# Patient Record
Sex: Female | Born: 1935 | Race: White | Hispanic: No | Marital: Married | Smoking: Former smoker
Health system: Southern US, Community
[De-identification: ages and names within clinical notes are randomized; demographics above are authoritative.]

## PROBLEM LIST (undated history)

## (undated) DIAGNOSIS — F0390 Unspecified dementia without behavioral disturbance: Secondary | ICD-10-CM

## (undated) DIAGNOSIS — I4891 Unspecified atrial fibrillation: Secondary | ICD-10-CM

## (undated) DIAGNOSIS — G459 Transient cerebral ischemic attack, unspecified: Secondary | ICD-10-CM

## (undated) DIAGNOSIS — H269 Unspecified cataract: Secondary | ICD-10-CM

## (undated) DIAGNOSIS — I10 Essential (primary) hypertension: Secondary | ICD-10-CM

## (undated) DIAGNOSIS — C801 Malignant (primary) neoplasm, unspecified: Secondary | ICD-10-CM

## (undated) HISTORY — PX: UPPER GI ENDOSCOPY: SHX6162

## (undated) HISTORY — PX: BREAST SURGERY: SHX581

## (undated) HISTORY — PX: COLONOSCOPY: SHX174

## (undated) HISTORY — PX: HYSTEROTOMY: SHX1776

---

## 2010-07-25 ENCOUNTER — Emergency Department: Payer: Self-pay | Admitting: Emergency Medicine

## 2013-02-16 ENCOUNTER — Emergency Department: Payer: Self-pay | Admitting: Emergency Medicine

## 2013-05-11 ENCOUNTER — Emergency Department: Payer: Self-pay | Admitting: Emergency Medicine

## 2013-05-11 LAB — CBC
HCT: 42.4 % (ref 35.0–47.0)
MCH: 29.1 pg (ref 26.0–34.0)
MCHC: 33.9 g/dL (ref 32.0–36.0)
MCV: 86 fL (ref 80–100)
Platelet: 266 10*3/uL (ref 150–440)
RBC: 4.92 10*6/uL (ref 3.80–5.20)

## 2013-05-11 LAB — URINALYSIS, COMPLETE
Bacteria: NONE SEEN
Bilirubin,UR: NEGATIVE
Glucose,UR: NEGATIVE mg/dL (ref 0–75)
Ketone: NEGATIVE
Leukocyte Esterase: NEGATIVE
RBC,UR: 17 /HPF (ref 0–5)
Specific Gravity: 1.008 (ref 1.003–1.030)
Squamous Epithelial: 1
Transitional Epi: <1

## 2013-05-11 LAB — COMPREHENSIVE METABOLIC PANEL
Alkaline Phosphatase: 87 U/L (ref 50–136)
Anion Gap: 5 — ABNORMAL LOW (ref 7–16)
BUN: 13 mg/dL (ref 7–18)
Creatinine: 1.21 mg/dL (ref 0.60–1.30)
EGFR (African American): 48 — ABNORMAL LOW
EGFR (Non-African Amer.): 41 — ABNORMAL LOW
Glucose: 108 mg/dL — ABNORMAL HIGH (ref 65–99)
Osmolality: 276 (ref 275–301)
Potassium: 3.7 mmol/L (ref 3.5–5.1)
SGOT(AST): 29 U/L (ref 15–37)
SGPT (ALT): 33 U/L (ref 12–78)
Sodium: 138 mmol/L (ref 136–145)
Total Protein: 7.4 g/dL (ref 6.4–8.2)

## 2015-01-27 ENCOUNTER — Emergency Department
Admission: EM | Admit: 2015-01-27 | Discharge: 2015-01-27 | Disposition: A | Discharge status: Home / Self Care | Payer: Medicare PPO | Attending: Emergency Medicine | Admitting: Emergency Medicine

## 2015-01-27 DIAGNOSIS — Y838 Other surgical procedures as the cause of abnormal reaction of the patient, or of later complication, without mention of misadventure at the time of the procedure: Secondary | ICD-10-CM | POA: Insufficient documentation

## 2015-01-27 DIAGNOSIS — K1379 Other lesions of oral mucosa: Secondary | ICD-10-CM

## 2015-01-27 DIAGNOSIS — L7622 Postprocedural hemorrhage and hematoma of skin and subcutaneous tissue following other procedure: Secondary | ICD-10-CM

## 2015-01-27 MED ORDER — AMOXICILLIN 500 MG PO CAPS: 500.0000 mg | ORAL_CAPSULE | Freq: Two times a day (BID) | ORAL | Status: AC

## 2015-01-27 MED ORDER — AMOXICILLIN 500 MG PO CAPS
ORAL_CAPSULE | ORAL | Status: AC
  Filled 2015-01-27: qty 1

## 2015-01-27 MED ORDER — LIDOCAINE-EPINEPHRINE (PF) 1 %-1:200000 IJ SOLN
INTRAMUSCULAR | Status: AC
  Administered 2015-01-27: 10 mL
  Filled 2015-01-27: qty 30

## 2015-01-27 MED ORDER — LIDOCAINE-EPINEPHRINE (PF) 1 %-1:200000 IJ SOLN
10.0000 mL | Freq: Once | INTRAMUSCULAR | Status: AC
  Administered 2015-01-27: 10 mL

## 2015-01-27 MED ORDER — AMOXICILLIN 500 MG PO CAPS: 500.0000 mg | ORAL_CAPSULE | Freq: Once | ORAL | Status: DC

## 2015-01-27 NOTE — ED Provider Notes (Signed)
[facility] Emergency Department Provider Note  ____________________________________________  Time seen: 3:45 AM  I have reviewed the triage vital signs and the nursing notes.   HISTORY  Chief Complaint Post-op Problem      HPI Whitnee Orzel is a 81 y.o. female presents with history of having 2 central incisors pulled by a Dentist in [place] at 7:30 PM last night with continued bleeding 2 hours after leaving the dental office. Patient of note is on Eliquis.     Past medical history Pacemaker  A. fib  Past surgical history   pacemaker No current outpatient prescriptions on file.  Allergies Review of patient's allergies indicates not on file.  No family history on file.  Social History History  Substance Use Topics  . Smoking status: Not on file  . Smokeless tobacco: Not on file  . Alcohol Use: Not on file    Review of Systems  Constitutional: Negative for fever. Eyes: Negative for visual changes. ENT: Negative for sore throat. Bleeding from tooth extraction site Cardiovascular: Negative for chest pain. Respiratory: Negative for shortness of breath. Gastrointestinal: Negative for abdominal pain, vomiting and diarrhea. Genitourinary: Negative for dysuria. Musculoskeletal: Negative for back pain. Skin: Negative for rash. Neurological: Negative for headaches, focal weakness or numbness.   10-point ROS otherwise negative.  ____________________________________________   PHYSICAL EXAM:  VITAL SIGNS: ED Triage Vitals  Enc Vitals Group     BP 01/27/15 0346 179/102 mmHg     Pulse Rate 01/27/15 0346 115     Resp 01/27/15 0346 18     Temp --      Temp src --      SpO2 01/27/15 0346 97 %     Weight 01/27/15 0346 151 lb (68.493 kg)     Height 01/27/15 0346 '5\' 4"'$  (1.626 m)     Head Cir --      Peak Flow --      Pain Score --      Pain Loc --      Pain Edu? --      Excl. in [place]? --     Constitutional: Alert and oriented. Well  appearing and in no distress. Eyes: Conjunctivae are normal. PERRL. Normal extraocular movements. ENT   Head: Normocephalic and atraumatic.   Nose: No congestion/rhinnorhea.   Mouth/Throat: Mucous membranes are moist. Active bleeding from left mandible central incisor   Neck: No stridor. Hematological/Lymphatic/Immunilogical: No cervical lymphadenopathy. Cardiovascular: Normal rate, regular rhythm. Normal and symmetric distal pulses are present in all extremities. No murmurs, rubs, or gallops. Respiratory: Normal respiratory effort without tachypnea nor retractions. Breath sounds are clear and equal bilaterally. No wheezes/rales/rhonchi. Gastrointestinal: Soft and nontender. No distention. There is no CVA tenderness. Genitourinary: deferred Musculoskeletal: Nontender with normal range of motion in all extremities. No joint effusions.  No lower extremity tenderness nor edema. Neurologic:  Normal speech and language. No gross focal neurologic deficits are appreciated. Speech is normal.  Skin:  Skin is warm, dry and intact. No rash noted. Psychiatric: Mood and affect are normal. Speech and behavior are normal. Patient exhibits appropriate insight and judgment.  ____________________________________________      INITIAL IMPRESSION / ASSESSMENT AND PLAN / ED COURSE  Pertinent labs & imaging results that were available during my care of the patient were reviewed by me and considered in my medical decision making (see chart for details).  Attempted to control bleeding with Surgicel however bleeding persisted. As such patient was injected 1% lidocaine with epinephrine and subsequently  local cauterization was performed. Bleeding cessation was achieved with the procedure  ____________________________________________   FINAL CLINICAL IMPRESSION(S) / ED DIAGNOSES  Final diagnoses:  Bleeding from mouth  Postoperative hemorrhage of subcutaneous tissue following non-dermatologic  procedure      Gregor Hams, MD 01/28/15 [phone]

## 2015-01-27 NOTE — ED Notes (Signed)
Surgicel gauze applied to bleeding area in oral cavity by Dr. Owens Shark.

## 2015-01-27 NOTE — ED Notes (Signed)
Pt to triage via w/c actively bleeding from mouth; pt reports having 2 lower teeth pulled at 730pm; 2hrs later began bleeding and has not stopped; pt currently on Eloquist

## 2015-01-27 NOTE — ED Notes (Signed)
RN returns to bedside to reassess site. Patient with some very minor oozing from extraction sites, however site that was previously bleeding does not appear to be contributory to the aforementioned oozing. MD made aware.

## 2015-01-27 NOTE — ED Notes (Signed)
RN returns to bedside to assess bleeding site. Gauze and surgicel removed. Hemostasis achieved. MD made aware and asked to come to bedside for patient reassessment. MD in to speak with patient.

## 2015-01-27 NOTE — ED Notes (Signed)
MD and RN to bedside to attempt cautery of bleeding area. Area numbed by Dr. Owens Shark with 1% Lidocaine with Epi. Cautery attempted - patient's bleeding slowed, however did not resolve. Surgicel placed once again. RN to reassess at 0600 per MD direction.

## 2015-01-27 NOTE — Discharge Instructions (Signed)
Dental Care and Dentist Visits Dental care supports good overall health. Regular dental visits can also help you avoid dental pain, bleeding, infection, and other more serious health problems in the future. It is important to keep the mouth healthy because diseases in the teeth, gums, and other oral tissues can spread to other areas of the body. Some problems, such as diabetes, heart disease, and pre-term labor have been associated with poor oral health.  See your dentist every 6 months. If you experience emergency problems such as a toothache or broken tooth, go to the dentist right away. If you see your dentist regularly, you may catch problems early. It is easier to be treated for problems in the early stages.  WHAT TO EXPECT AT A DENTIST VISIT  Your dentist will look for many common oral health problems and recommend proper treatment. At your regular dental visit, you can expect:  Gentle cleaning of the teeth and gums. This includes scraping and polishing. This helps to remove the sticky substance around the teeth and gums (plaque). Plaque forms in the mouth shortly after eating. Over time, plaque hardens on the teeth as tartar. If tartar is not removed regularly, it can cause problems. Cleaning also helps remove stains.  Periodic X-rays. These pictures of the teeth and supporting bone will help your dentist assess the health of your teeth.  Periodic fluoride treatments. Fluoride is a natural mineral shown to help strengthen teeth. Fluoride treatmentinvolves applying a fluoride gel or varnish to the teeth. It is most commonly done in children.  Examination of the mouth, tongue, jaws, teeth, and gums to look for any oral health problems, such as:  Cavities (dental caries). This is decay on the tooth caused by plaque, sugar, and acid in the mouth. It is best to catch a cavity when it is small.  Inflammation of the gums caused by plaque buildup (gingivitis).  Problems with the mouth or malformed  or misaligned teeth.  Oral cancer or other diseases of the soft tissues or jaws. KEEP YOUR TEETH AND GUMS HEALTHY For healthy teeth and gums, follow these general guidelines as well as your dentist's specific advice:  Have your teeth professionally cleaned at the dentist every 6 months.  Brush twice daily with a fluoride toothpaste.  Floss your teeth daily.  Ask your dentist if you need fluoride supplements, treatments, or fluoride toothpaste.  Eat a healthy diet. Reduce foods and drinks with added sugar.  Avoid smoking. TREATMENT FOR ORAL HEALTH PROBLEMS If you have oral health problems, treatment varies depending on the conditions present in your teeth and gums.  Your caregiver will most likely recommend good oral hygiene at each visit.  For cavities, gingivitis, or other oral health disease, your caregiver will perform a procedure to treat the problem. This is typically done at a separate appointment. Sometimes your caregiver will refer you to another dental specialist for specific tooth problems or for surgery. SEEK IMMEDIATE DENTAL CARE IF:  You have pain, bleeding, or soreness in the gum, tooth, jaw, or mouth area.  A permanent tooth becomes loose or separated from the gum socket.  You experience a blow or injury to the mouth or jaw area. Document Released: 04/12/2011 Document Revised: 10/23/2011 Document Reviewed: 04/12/2011 [facility] Patient Information 2015 [place], [place]. This information is not intended to replace advice given to you by your health care provider. Make sure you discuss any questions you have with your health care provider.  PLEASE FOLLOW-UP WITH YOUR DENTIST TODAY

## 2016-03-07 ENCOUNTER — Observation Stay
Admission: EM | Admit: 2016-03-07 | Discharge: 2016-03-08 | Disposition: A | Discharge status: Home / Self Care | Payer: Medicare HMO | Attending: Internal Medicine | Admitting: Internal Medicine

## 2016-03-07 ENCOUNTER — Emergency Department: Payer: Medicare HMO

## 2016-03-07 ENCOUNTER — Observation Stay: Payer: Medicare HMO

## 2016-03-07 ENCOUNTER — Observation Stay (HOSPITAL_BASED_OUTPATIENT_CLINIC_OR_DEPARTMENT_OTHER)
Admit: 2016-03-07 | Discharge: 2016-03-07 | Disposition: A | Discharge status: Home / Self Care | Payer: Medicare HMO | Attending: Internal Medicine | Admitting: Internal Medicine

## 2016-03-07 ENCOUNTER — Encounter: Payer: Self-pay | Admitting: Emergency Medicine

## 2016-03-07 DIAGNOSIS — R531 Weakness: Secondary | ICD-10-CM | POA: Diagnosis present

## 2016-03-07 DIAGNOSIS — I639 Cerebral infarction, unspecified: Secondary | ICD-10-CM | POA: Diagnosis not present

## 2016-03-07 DIAGNOSIS — Z9889 Other specified postprocedural states: Secondary | ICD-10-CM | POA: Insufficient documentation

## 2016-03-07 DIAGNOSIS — Z853 Personal history of malignant neoplasm of breast: Secondary | ICD-10-CM | POA: Diagnosis not present

## 2016-03-07 DIAGNOSIS — IMO0002 Reserved for concepts with insufficient information to code with codable children: Secondary | ICD-10-CM

## 2016-03-07 DIAGNOSIS — I6330 Cerebral infarction due to thrombosis of unspecified cerebral artery: Secondary | ICD-10-CM

## 2016-03-07 DIAGNOSIS — I2510 Atherosclerotic heart disease of native coronary artery without angina pectoris: Secondary | ICD-10-CM | POA: Insufficient documentation

## 2016-03-07 DIAGNOSIS — Z87891 Personal history of nicotine dependence: Secondary | ICD-10-CM | POA: Insufficient documentation

## 2016-03-07 DIAGNOSIS — I6523 Occlusion and stenosis of bilateral carotid arteries: Secondary | ICD-10-CM | POA: Insufficient documentation

## 2016-03-07 DIAGNOSIS — I10 Essential (primary) hypertension: Secondary | ICD-10-CM | POA: Diagnosis not present

## 2016-03-07 DIAGNOSIS — Z950 Presence of cardiac pacemaker: Secondary | ICD-10-CM | POA: Insufficient documentation

## 2016-03-07 DIAGNOSIS — Z886 Allergy status to analgesic agent status: Secondary | ICD-10-CM | POA: Insufficient documentation

## 2016-03-07 DIAGNOSIS — F329 Major depressive disorder, single episode, unspecified: Secondary | ICD-10-CM | POA: Insufficient documentation

## 2016-03-07 DIAGNOSIS — Z882 Allergy status to sulfonamides status: Secondary | ICD-10-CM | POA: Insufficient documentation

## 2016-03-07 DIAGNOSIS — Z888 Allergy status to other drugs, medicaments and biological substances status: Secondary | ICD-10-CM | POA: Insufficient documentation

## 2016-03-07 DIAGNOSIS — I700 Atherosclerosis of aorta: Secondary | ICD-10-CM | POA: Diagnosis not present

## 2016-03-07 DIAGNOSIS — I69898 Other sequelae of other cerebrovascular disease: Secondary | ICD-10-CM | POA: Diagnosis present

## 2016-03-07 DIAGNOSIS — I480 Paroxysmal atrial fibrillation: Secondary | ICD-10-CM | POA: Diagnosis not present

## 2016-03-07 DIAGNOSIS — Z8673 Personal history of transient ischemic attack (TIA), and cerebral infarction without residual deficits: Secondary | ICD-10-CM | POA: Diagnosis not present

## 2016-03-07 DIAGNOSIS — F0390 Unspecified dementia without behavioral disturbance: Secondary | ICD-10-CM | POA: Diagnosis not present

## 2016-03-07 HISTORY — DX: Essential (primary) hypertension: I10

## 2016-03-07 HISTORY — DX: Unspecified dementia, unspecified severity, without behavioral disturbance, psychotic disturbance, mood disturbance, and anxiety: F03.90

## 2016-03-07 HISTORY — DX: Malignant (primary) neoplasm, unspecified: C80.1

## 2016-03-07 HISTORY — DX: Transient cerebral ischemic attack, unspecified: G45.9

## 2016-03-07 HISTORY — DX: Unspecified cataract: H26.9

## 2016-03-07 LAB — URINALYSIS COMPLETE WITH MICROSCOPIC (ARMC ONLY)
Bacteria, UA: NONE SEEN
Bilirubin Urine: NEGATIVE
GLUCOSE, UA: NEGATIVE mg/dL
KETONES UR: NEGATIVE mg/dL
Leukocytes, UA: NEGATIVE
Nitrite: NEGATIVE
PROTEIN: 100 mg/dL — AB
SPECIFIC GRAVITY, URINE: 1.005 (ref 1.005–1.030)
Squamous Epithelial / LPF: NONE SEEN
pH: 7 (ref 5.0–8.0)

## 2016-03-07 LAB — CBC
HCT: 32.3 % — ABNORMAL LOW (ref 35.0–47.0)
HEMOGLOBIN: 10.4 g/dL — AB (ref 12.0–16.0)
MCH: 23.1 pg — ABNORMAL LOW (ref 26.0–34.0)
MCHC: 32.2 g/dL (ref 32.0–36.0)
MCV: 71.8 fL — AB (ref 80.0–100.0)
Platelets: 327 10*3/uL (ref 150–440)
RBC: 4.5 MIL/uL (ref 3.80–5.20)
RDW: 16.4 % — ABNORMAL HIGH (ref 11.5–14.5)
WBC: 10.2 10*3/uL (ref 3.6–11.0)

## 2016-03-07 LAB — COMPREHENSIVE METABOLIC PANEL
ALBUMIN: 4 g/dL (ref 3.5–5.0)
ALT: 29 U/L (ref 14–54)
AST: 50 U/L — ABNORMAL HIGH (ref 15–41)
Alkaline Phosphatase: 92 U/L (ref 38–126)
Anion gap: 11 (ref 5–15)
BUN: 17 mg/dL (ref 6–20)
CO2: 24 mmol/L (ref 22–32)
CREATININE: 1.19 mg/dL — AB (ref 0.44–1.00)
Calcium: 9.5 mg/dL (ref 8.9–10.3)
Chloride: 104 mmol/L (ref 101–111)
GFR calc Af Amer: 47 mL/min — ABNORMAL LOW (ref >60)
GFR, EST NON AFRICAN AMERICAN: 40 mL/min — AB (ref >60)
Glucose, Bld: 148 mg/dL — ABNORMAL HIGH (ref 65–99)
Potassium: 4 mmol/L (ref 3.5–5.1)
Sodium: 139 mmol/L (ref 135–145)
TOTAL PROTEIN: 7.8 g/dL (ref 6.5–8.1)
Total Bilirubin: 0.5 mg/dL (ref 0.3–1.2)

## 2016-03-07 LAB — TROPONIN I

## 2016-03-07 MED ORDER — SODIUM CHLORIDE 0.9 % IV SOLN
INTRAVENOUS | Status: DC
  Administered 2016-03-07 – 2016-03-08 (×2): via INTRAVENOUS

## 2016-03-07 MED ORDER — HYDRALAZINE HCL 20 MG/ML IJ SOLN
10.0000 mg | Freq: Four times a day (QID) | INTRAMUSCULAR | Status: DC | PRN
  Administered 2016-03-08: 07:00:00 10 mg via INTRAVENOUS
  Filled 2016-03-07: qty 1

## 2016-03-07 MED ORDER — CLOPIDOGREL BISULFATE 75 MG PO TABS
75.0000 mg | ORAL_TABLET | Freq: Every day | ORAL | Status: DC
  Filled 2016-03-07: qty 1

## 2016-03-07 MED ORDER — SENNOSIDES-DOCUSATE SODIUM 8.6-50 MG PO TABS: 1.0000 | ORAL_TABLET | Freq: Every evening | ORAL | Status: DC | PRN

## 2016-03-07 MED ORDER — STROKE: EARLY STAGES OF RECOVERY BOOK
Freq: Once | Status: AC
  Administered 2016-03-07: 19:00:00

## 2016-03-07 MED ORDER — ENOXAPARIN SODIUM 40 MG/0.4ML ~~LOC~~ SOLN: 40.0000 mg | SUBCUTANEOUS | Status: DC

## 2016-03-07 NOTE — H&P (Signed)
[place] [facility] [place] NAME: Melissa Palmer    MR#:  [id number]  DATE OF BIRTH:  Jan 09, 1928  DATE OF ADMISSION:  03/07/2016  PRIMARY CARE PHYSICIAN: Denita Lung, DO   REQUESTING/REFERRING PHYSICIAN: Delman Kitten, MD  CHIEF COMPLAINT:   Chief Complaint  Patient presents with  . Altered Mental Status   HISTORY OF PRESENT ILLNESS:  Melissa Palmer  is a 82 y.o. female with a known history of TIA and 2 strokes in the past who was taken off of Eliquis for a bronchoscopy on yesterday at [facility]. Afterward returned home and as sedation wore off appeared to return to baseline having normal conversation with family members until going to bed. This AM awakened at about 0400.  Had to go to the bathroom repeatedly and required assistance due to complaints that her Rt leg was not working correctly.  Was also noted by family to be confused/Hallucinating. Remains off Eliquis. Patient with a history of pacemaker and PAF. PAST MEDICAL HISTORY:   Past Medical History:  Diagnosis Date  . Cancer ([place])   . Cataract   . Dementia   . Hypertension   . TIA (transient ischemic attack)     PAST SURGICAL HISTORY:   Past Surgical History:  Procedure Laterality Date  . BREAST SURGERY    . COLONOSCOPY    . HYSTEROTOMY    . UPPER GI ENDOSCOPY      SOCIAL HISTORY:   Social History  Substance Use Topics  . Smoking status: Former Research scientist (life sciences)  . Smokeless tobacco: Never Used  . Alcohol use No    FAMILY HISTORY:  History reviewed. No pertinent family history. Mom - brain hemorrhage  DRUG ALLERGIES:   Allergies  Allergen Reactions  . Asa [Aspirin] Anaphylaxis  . Flagyl [Metronidazole] Itching  . Hydrochlorothiazide Itching  . Sulfa Antibiotics Itching   REVIEW OF SYSTEMS:   Review of Systems  Constitutional: Negative for chills, fever and weight loss.  HENT: Negative for nosebleeds and sore throat.   Eyes: Negative for blurred vision.  Respiratory: Negative  for cough, shortness of breath and wheezing.   Cardiovascular: Negative for chest pain, orthopnea, leg swelling and PND.  Gastrointestinal: Negative for abdominal pain, constipation, diarrhea, heartburn, nausea and vomiting.  Genitourinary: Negative for dysuria and urgency.  Musculoskeletal: Negative for back pain.  Skin: Negative for rash.  Neurological: Negative for dizziness, speech change and headaches.  Endo/Heme/Allergies: Does not bruise/bleed easily.  Psychiatric/Behavioral: Positive for hallucinations. Negative for depression.   MEDICATIONS AT HOME:   Prior to Admission medications   Not on File    VITAL SIGNS:  Blood pressure (!) 172/62, pulse 63, temperature 97 F (36.1 C), temperature source Oral, resp. rate 19, height '5\' 4"'$  (1.626 m), weight 65.8 kg (145 lb), SpO2 97 %. PHYSICAL EXAMINATION:  Physical Exam  Constitutional: She is oriented to person, place, and time and well-developed, well-nourished, and in no distress.  HENT:  Head: Normocephalic and atraumatic.  Eyes: Conjunctivae and EOM are normal. Pupils are equal, round, and reactive to light.  Neck: Normal range of motion. Neck supple. No tracheal deviation present. No thyromegaly present.  Cardiovascular: Normal rate, regular rhythm and normal heart sounds.   Pulmonary/Chest: Effort normal and breath sounds normal. No respiratory distress. She has no wheezes. She exhibits no tenderness.  Abdominal: Soft. Bowel sounds are normal. She exhibits no distension. There is no tenderness.  Musculoskeletal: Normal range of motion.  Neurological: She is alert and oriented to  person, place, and time. No cranial nerve deficit.  Skin: Skin is warm and dry. No rash noted.  Psychiatric: Mood and affect normal.   LABORATORY PANEL:   CBC  Recent Labs Lab 03/07/16 1112  WBC 10.2  HGB 10.4*  HCT 32.3*  PLT 327    ------------------------------------------------------------------------------------------------------------------  Chemistries   Recent Labs Lab 03/07/16 1112  NA 139  K 4.0  CL 104  CO2 24  GLUCOSE 148*  BUN 17  CREATININE 1.19*  CALCIUM 9.5  AST 50*  ALT 29  ALKPHOS 92  BILITOT 0.5    RADIOLOGY:  Dg Chest 2 View  Result Date: 03/07/2016 CLINICAL DATA:  Status post bronchoscopy. History of breast carcinoma. Confusion. EXAM: CHEST  2 VIEW COMPARISON:  None. FINDINGS: There is generalized interstitial prominence throughout the mid and lower lung zones. There is no airspace consolidation. There are scattered areas of scarring in the right base. There are clips in each lower lobe region. Heart size and pulmonary vascularity are within normal limits. Pacemaker leads are attached to the right atrium and right ventricle. No adenopathy. There is extensive atherosclerotic calcification in the aorta. No bone lesions are evident. IMPRESSION: Generalized interstitial prominence throughout the mid and lower lung zones. Suspect fibrotic type change, although a degree of interstitial edema superimposed cannot be excluded radiographically. Scarring right base. Cardiac silhouette within normal limits. There is aortic atherosclerosis. No adenopathy evident. Pacemaker leads attached to right atrium and right ventricle. Electronically Signed   By: Lowella Grip III M.D.   On: 03/07/2016 12:42  Ct Head Wo Contrast  Result Date: 03/07/2016 CLINICAL DATA:  Confusion today. Disorientation. Visual hallucinations. EXAM: CT HEAD WITHOUT CONTRAST TECHNIQUE: Contiguous axial images were obtained from the base of the skull through the vertex without intravenous contrast. COMPARISON:  None. FINDINGS: The brain shows generalized atrophy. This is frontal lobe predominant. No evidence of acute infarction, mass lesion, hemorrhage, hydrocephalus or extra-axial collection. The calvarium is unremarkable. Sinuses,  middle ears and mastoids are clear. There is atherosclerotic calcification of the major vessels at the base of the brain. IMPRESSION: No acute finding by CT. Generalized brain atrophy, most advanced in the frontal lobes. Electronically Signed   By: Nelson Chimes M.D.   On: 03/07/2016 13:16  IMPRESSION AND PLAN:  82 yo f with h/o CVA being admitted for possible new CVA  * Acute CVA: - Neuro seen and requests further CVA w/up - stroke order sets - can't have MRI due to pacemaker - repeat CT head in am - Pavix (asa allergy), Elliquis on hold due to bronch y'day (will hold for now) - echo, carotid dopplers  * Uncontrolled HTN - likely due to acute CVA - will let it autoregulate in acute CVA phase - will prn hydralazine to keep SBP 140-160 Range  * Paroxysmal A.fib: - was on eliquis which is on hold - HR under control  * Confusion/Hallucination - could be due to effect of anesthesia wit underlying dementia - seems clear and back to baseline now. - MONITOR   All the records are reviewed and case discussed with ED provider. Management plans discussed with the patient, family and they are in agreement.  CODE STATUS: FULL CODE  TOTAL TIME TAKING CARE OF THIS PATIENT: 45 minutes.    [facility], Jayke Caul M.D on 03/07/2016 at 4:11 PM  Between 7am to 6pm - Pager - [phone]  After 6pm go to [email or web address] - Proofreader  [employer]  [phone]  CC: Primary care physician; Denita Lung, DO   Note: This dictation was prepared with Dragon dictation along with smaller phrase technology. Any transcriptional errors that result from this process are unintentional.

## 2016-03-07 NOTE — Progress Notes (Signed)
Pt and family requested pts normal xanax due to pt not sleeping the last couple days.  Since the pt is getting Q2H neuro checks, it will be held at this time, so it won't interfere with the checks.

## 2016-03-07 NOTE — ED Notes (Signed)
Dr. Reynolds at bedside.

## 2016-03-07 NOTE — Consult Note (Addendum)
Referring Physician: Jacqualine Code    Chief Complaint: Confusion  HPI: Melissa Palmer is an 82 y.o. female with a history of TIA and stroke in the past who was taken off of Eliquis for a bronchoscopy on yesterday at [facility].  Patient had lesion and lymph node biopsy.  Afterward returned home and as sedation wore off appeared to return to baseline having normal conversation with family members until going to bed.  This AM awakened at about 0400.  Had to go to the bathroom repeatedly and required assistance due to complaints that her leg was not working correctly.  Was also noted by family to be confused.  Remains off Eliquis.  Initial NIHSS of 1.   Patient with a history of pacemaker and PAF.  Date last known well: 03/06/2016 Time last known well: Time: 22:00 tPA Given: No: Outside time window, recent surgery  Past Medical History:  Diagnosis Date  . Cancer ([place])   . Cataract   . Dementia   . Hypertension   . TIA (transient ischemic attack)     Past Surgical History:  Procedure Laterality Date  . BREAST SURGERY    . COLONOSCOPY    . HYSTEROTOMY    . UPPER GI ENDOSCOPY      Family history:  Two daughters alive and well.   Social History:  reports that she has quit smoking. She has never used smokeless tobacco. She reports that she does not drink alcohol or use drugs.  Allergies:  Allergies  Allergen Reactions  . Asa [Aspirin] Anaphylaxis  . Flagyl [Metronidazole] Itching  . Hydrochlorothiazide Itching  . Sulfa Antibiotics Itching    Medications: I have reviewed the patient's current medications. Prior to Admission:  Prior to Admission medications   Not on File     ROS: History obtained from the patient  General ROS: negative for - chills, fatigue, fever, night sweats, weight gain or weight loss Psychological ROS: as noted in HPI Ophthalmic ROS: negative for - blurry vision, double vision, eye pain or loss of vision ENT ROS: negative for - epistaxis, nasal discharge, oral  lesions, sore throat, tinnitus or vertigo Allergy and Immunology ROS: negative for - hives or itchy/watery eyes Hematological and Lymphatic ROS: negative for - bleeding problems, bruising or swollen lymph nodes Endocrine ROS: negative for - galactorrhea, hair pattern changes, polydipsia/polyuria or temperature intolerance Respiratory ROS: negative for - cough, hemoptysis, shortness of breath or wheezing Cardiovascular ROS: negative for - chest pain, dyspnea on exertion, edema or irregular heartbeat Gastrointestinal ROS: negative for - abdominal pain, diarrhea, hematemesis, nausea/vomiting or stool incontinence Genito-Urinary ROS: negative for - dysuria, hematuria, incontinence or urinary frequency/urgency Musculoskeletal ROS: negative for - joint swelling or muscular weakness Neurological ROS: as noted in HPI Dermatological ROS: negative for rash and skin lesion changes  Physical Examination: Blood pressure (!) 172/64, pulse 63, temperature 97 F (36.1 C), temperature source Oral, resp. rate 17, height '5\' 4"'$  (1.626 m), weight 65.8 kg (145 lb), SpO2 97 %.  HEENT-  Normocephalic, no lesions, without obvious abnormality.  Normal external eye and conjunctiva.  Normal TM's bilaterally.  Normal auditory canals and external ears. Normal external nose, mucus membranes and septum.  Normal pharynx. Cardiovascular- S1, S2 normal, pulses palpable throughout   Lungs- chest clear, no wheezing, rales, normal symmetric air entry Abdomen- soft, non-tender; bowel sounds normal; no masses,  no organomegaly Extremities- no edema Lymph-no adenopathy palpable Musculoskeletal-no joint tenderness, deformity or swelling Skin-warm and dry, no hyperpigmentation, vitiligo, or suspicious lesions  Neurological  Examination Mental Status: Alert.  Confused although she knows the year and the President is tangential and confabulates details of the past few days. Speech fluent without evidence of aphasia.  Able to follow 3  step commands without difficulty. Cranial Nerves: II: Discs flat bilaterally; Visual fields grossly normal, pupils equal, round, reactive to light and accommodation III,IV, VI: ptosis not present, extra-ocular motions intact bilaterally V,VII: smile symmetric, facial light touch sensation normal bilaterally VIII: hearing normal bilaterally IX,X: gag reflex present XI: bilateral shoulder shrug XII: midline tongue extension Motor: Right : Upper extremity   5/5    Left:     Upper extremity   5/5  Lower extremity   5/5     Lower extremity   5/5 Tone and bulk:normal tone throughout; no atrophy noted Sensory: Pinprick and light touch appear decreased in the RLE Deep Tendon Reflexes: 2+ and symmetric throughout Plantars: Right: downgoing   Left: downgoing Cerebellar: Normal finger-to-nose and normal heel-to-shin testing bilaterally Gait: appears to drag the right leg   Laboratory Studies:  Basic Metabolic Panel:  Recent Labs Lab 03/07/16 1112  NA 139  K 4.0  CL 104  CO2 24  GLUCOSE 148*  BUN 17  CREATININE 1.19*  CALCIUM 9.5    Liver Function Tests:  Recent Labs Lab 03/07/16 1112  AST 50*  ALT 29  ALKPHOS 92  BILITOT 0.5  PROT 7.8  ALBUMIN 4.0   No results for input(s): LIPASE, AMYLASE in the last 168 hours. No results for input(s): AMMONIA in the last 168 hours.  CBC:  Recent Labs Lab 03/07/16 1112  WBC 10.2  HGB 10.4*  HCT 32.3*  MCV 71.8*  PLT 327    Cardiac Enzymes:  Recent Labs Lab 03/07/16 1112  TROPONINI <0.03    BNP: Invalid input(s): POCBNP  CBG: No results for input(s): GLUCAP in the last 168 hours.  Microbiology: Results for orders placed or performed in visit on 05/11/13  Urine culture     Status: None   Collection Time: 05/11/13  5:05 PM  Result Value Ref Range Status   Micro Text Report   Final       SOURCE: CLEAN CATCH    COMMENT                   MIXED BACTERIAL ORGANISMS   COMMENT                   RESULTS SUGGESTIVE  OF CONTAMINATION   ANTIBIOTIC                                                        Coagulation Studies: No results for input(s): LABPROT, INR in the last 72 hours.  Urinalysis:  Recent Labs Lab 03/07/16 1247  COLORURINE STRAW*  LABSPEC 1.005  PHURINE 7.0  GLUCOSEU NEGATIVE  HGBUR 2+*  BILIRUBINUR NEGATIVE  KETONESUR NEGATIVE  PROTEINUR 100*  NITRITE NEGATIVE  LEUKOCYTESUR NEGATIVE    Lipid Panel: No results found for: CHOL, TRIG, HDL, CHOLHDL, VLDL, LDLCALC  HgbA1C: No results found for: HGBA1C  Urine Drug Screen:  No results found for: LABOPIA, COCAINSCRNUR, LABBENZ, AMPHETMU, THCU, LABBARB  Alcohol Level: No results for input(s): ETH in the last 168 hours.  Other results: EKG: sinus rhythm at 72 bpm.  Imaging: Dg Chest 2 View  Result Date: 03/07/2016 CLINICAL DATA:  Status post bronchoscopy. History of breast carcinoma. Confusion. EXAM: CHEST  2 VIEW COMPARISON:  None. FINDINGS: There is generalized interstitial prominence throughout the mid and lower lung zones. There is no airspace consolidation. There are scattered areas of scarring in the right base. There are clips in each lower lobe region. Heart size and pulmonary vascularity are within normal limits. Pacemaker leads are attached to the right atrium and right ventricle. No adenopathy. There is extensive atherosclerotic calcification in the aorta. No bone lesions are evident. IMPRESSION: Generalized interstitial prominence throughout the mid and lower lung zones. Suspect fibrotic type change, although a degree of interstitial edema superimposed cannot be excluded radiographically. Scarring right base. Cardiac silhouette within normal limits. There is aortic atherosclerosis. No adenopathy evident. Pacemaker leads attached to right atrium and right ventricle. Electronically Signed   By: Lowella Grip III M.D.   On: 03/07/2016 12:42  Ct Head Wo Contrast  Result Date: 03/07/2016 CLINICAL DATA:  Confusion today.  Disorientation. Visual hallucinations. EXAM: CT HEAD WITHOUT CONTRAST TECHNIQUE: Contiguous axial images were obtained from the base of the skull through the vertex without intravenous contrast. COMPARISON:  None. FINDINGS: The brain shows generalized atrophy. This is frontal lobe predominant. No evidence of acute infarction, mass lesion, hemorrhage, hydrocephalus or extra-axial collection. The calvarium is unremarkable. Sinuses, middle ears and mastoids are clear. There is atherosclerotic calcification of the major vessels at the base of the brain. IMPRESSION: No acute finding by CT. Generalized brain atrophy, most advanced in the frontal lobes. Electronically Signed   By: Nelson Chimes M.D.   On: 03/07/2016 13:16   Assessment: 82 y.o. female presenting with confusion and worsening gait.  Symptoms present on awakening this morning.  Patient off Eliquis for recent procedure.  Initial NIHSS of 1.  Head CT personally reviewed and shows no acute changes.  Patient with pacemaker and unable to have MRI of the brain but based on presentation, CVA is most likely diagnosis, likely embolic in nature with history of atrial fibrillation.  Further work up recommended.    Stroke Risk Factors - atrial fibrillation and hypertension  Plan: 1. HgbA1c, fasting lipid panel 2. Repeat head CT on 7/26.   3. PT consult, OT consult, Speech consult 4. Echocardiogram 5. Carotid dopplers 6. Prophylactic therapy-Patient unable to take ASA due to allergy (anaphylaxis). Will check with surgery to determine when patient will be safe to restart Eliquis.   7. NPO until RN stroke swallow screen 8. Telemetry monitoring 9. Frequent neuro checks  Case discussed with Dr. Fortino Sic, MD Neurology [phone] 03/07/2016, 3:16 PM

## 2016-03-07 NOTE — Care Management Obs Status (Signed)
[place] NOTIFICATION   Patient Details  Name: Melissa Palmer MRN: [id number] Date of Birth: 1928-03-12   Medicare Observation Status Notification Given:  Yes  Reviewed with patient and family.  Signed copy sent to HIM    Beverly Sessions, RN 03/07/2016, 4:15 PM

## 2016-03-07 NOTE — Progress Notes (Signed)
Pt passed swallow screen in ED. Per Dr. Manuella Ghazi, start regular diet.  Pt also has order chest xray however pt already had xray while in ED.  Order discontinued per Dr. Manuella Ghazi.  Clarise Cruz, RN

## 2016-03-07 NOTE — ED Provider Notes (Signed)
[facility] Emergency Department Provider Note  ____________________________________________  Time seen: Approximately 11:14 AM  I have reviewed the triage vital signs and the nursing notes.   HISTORY  Chief Complaint Altered Mental Status    HPI Melissa Palmer is a 82 y.o. female who had a bronchoscopy yesterday. Apparently the procedure went well, we did do some biopsies in the lung. This morning, the patient woke up around 5 AM she is hallucinating that other family members were in the room with her. She has also been intermittently confused today. Family reports that she is alert, but sometimes is saying strange things or seeing family members who are not in the area.  No nausea or vomiting. No fevers or chills. No chest pain or trouble breathing.She is not having any facial droop, earlier today she is reporting nose hard for her to move her right leg ( describe like it just didn't want a work  ) but this is evidently gone away.   Past Medical History:  Diagnosis Date  . Cancer ([place])   . Cataract   . Dementia   . Hypertension   . TIA (transient ischemic attack)     There are no active problems to display for this patient.   Past Surgical History:  Procedure Laterality Date  . BREAST SURGERY    . COLONOSCOPY    . HYSTEROTOMY    . UPPER GI ENDOSCOPY        Allergies Asa [aspirin]; Flagyl [metronidazole]; Hydrochlorothiazide; and Sulfa antibiotics  History reviewed. No pertinent family history.  Social History Social History  Substance Use Topics  . Smoking status: Former Research scientist (life sciences)  . Smokeless tobacco: Never Used  . Alcohol use No    Review of Systems Constitutional: No fever/chills Eyes: No visual changes. ENT: No sore throat. Cardiovascular: Denies chest pain. Respiratory: Denies shortness of breath. Gastrointestinal: No abdominal pain.  No nausea, no vomiting.  No diarrhea.  No constipation. Genitourinary: Negative for  dysuria. Musculoskeletal: Negative for back pain. Skin: Negative for rash. Neurological: Negative for headaches, focal weakness or numbness.Family reports confusion  10-point ROS otherwise negative.  ____________________________________________   PHYSICAL EXAM:  VITAL SIGNS: ED Triage Vitals [03/07/16 1103]  Enc Vitals Group     BP (!) 203/70     Pulse Rate 84     Resp 20     Temp 97 F (36.1 C)     Temp Source Oral     SpO2 96 %     Weight 145 lb (65.8 kg)     Height '5\' 4"'$  (1.626 m)     Head Circumference      Peak Flow      Pain Score 0     Pain Loc      Pain Edu?      Excl. in [place]?    Constitutional: Alert and orientedTo self, but not aware of the procedure she had yesterday, and she does occasionally repeat herself and seems to either be slightly confused or altered. Well appearing and in no acute distress. Eyes: Conjunctivae are normal. PERRL. EOMI. Head: Atraumatic. Nose: No congestion/rhinnorhea. Mouth/Throat: Mucous membranes are moist.  Oropharynx non-erythematous. Neck: No stridor.   Cardiovascular: Normal rate, regular rhythm. Grossly normal heart sounds.  Good peripheral circulation. Respiratory: Normal respiratory effort.  No retractions. Lungs CTAB. Gastrointestinal: Soft and nontender. No distention. Musculoskeletal: No lower extremity tenderness nor edema.  No joint effusions. Neurologic:  Normal speech and language. No gross focal neurologic deficits are appreciated. Skin:  Skin is warm, dry and intact. No rash noted. Psychiatric: Mood and affect are normal. Speech and behavior are normal.  ____________________________________________   LABS (all labs ordered are listed, but only abnormal results are displayed)  Labs Reviewed  CBC - Abnormal; Notable for the following:       Result Value   Hemoglobin 10.4 (*)    HCT 32.3 (*)    MCV 71.8 (*)    MCH 23.1 (*)    RDW 16.4 (*)    All other components within normal limits  COMPREHENSIVE METABOLIC  PANEL - Abnormal; Notable for the following:    Glucose, Bld 148 (*)    Creatinine, Ser 1.19 (*)    AST 50 (*)    GFR calc non Af Amer 40 (*)    GFR calc Af Amer 47 (*)    All other components within normal limits  URINALYSIS COMPLETEWITH MICROSCOPIC ([facility] ONLY) - Abnormal; Notable for the following:    Color, Urine STRAW (*)    APPearance CLEAR (*)    Hgb urine dipstick 2+ (*)    Protein, ur 100 (*)    All other components within normal limits  TROPONIN I   ____________________________________________ ______________________________   PROCEDURES  Procedure(s) performed: None  Critical Care performed: No  ____________________________________________   INITIAL IMPRESSION / ASSESSMENT AND PLAN / ED COURSE  Pertinent labs & imaging results that were available during my care of the patient were reviewed by me and considered in my medical decision making (see chart for details).  Patient presents with confusion first noticed about 5 AM this morning. This is an association with having a bronchoscopy done yesterday, however unclear if the 2 are related. She did have a patch on her left ear that they given her for nausea which was removed by myself, not sure exactly what the patches but certainly potential for confusion. Calm with antihistamines or other medication such a scopolamine at patient's age. She does not have any obvious neurologic deficit, but does seem to have some memory difficulty, frequently repeating herself, and question if thought processing has been affected.    Clinical Course  Comment By Time  ED ECG REPORT I, QUALE, MARK, the attending physician, personally viewed and interpreted this ECG.  Date: '@EDTODAY'$ @ EKG Time: 1231 Rate: 70 Rhythm: normal sinus rhythm QRS Axis: normal Intervals: normal ST/T Wave abnormalities: normal Conduction Disturbances: none Narrative Interpretation: unremarkable   Delman Kitten, MD 07/25 1241    ----------------------------------------- 3:25 PM on 03/07/2016 -----------------------------------------  Patient seen by Dr. Doy Mince. She recommends admission for a stroke evaluation. ____________________________________________   FINAL CLINICAL IMPRESSION(S) / ED DIAGNOSES  Final diagnoses:  Cerebrovascular accident (CVA) due to thrombosis of cerebral artery (HCC)      Delman Kitten, MD 03/07/16 1527

## 2016-03-07 NOTE — ED Notes (Signed)
Patient transported to X-ray 

## 2016-03-07 NOTE — ED Triage Notes (Signed)
Pt to ed with c/o confusion this am.  Pt had endoscopy yesterday.  Pt lives with family and per family pt awoke at 5 am and was confused, disoriented and having visual hallucinations.  Pt alert at this time. Oriented x 4.  However pt does report that she is seeing family members that do not live with her.

## 2016-03-08 ENCOUNTER — Observation Stay: Payer: Medicare HMO

## 2016-03-08 DIAGNOSIS — I639 Cerebral infarction, unspecified: Secondary | ICD-10-CM | POA: Diagnosis not present

## 2016-03-08 LAB — LIPID PANEL
Cholesterol: 211 mg/dL — ABNORMAL HIGH (ref 0–200)
HDL: 42 mg/dL (ref >40)
LDL CALC: 148 mg/dL — AB (ref 0–99)
Total CHOL/HDL Ratio: 5 RATIO
Triglycerides: 106 mg/dL (ref <150)
VLDL: 21 mg/dL (ref 0–40)

## 2016-03-08 LAB — ECHOCARDIOGRAM COMPLETE
Height: 64 in
Weight: 2320 oz

## 2016-03-08 MED ORDER — FLUVOXAMINE MALEATE 50 MG PO TABS
25.0000 mg | ORAL_TABLET | Freq: Every day | ORAL | Status: DC
  Filled 2016-03-08: qty 1

## 2016-03-08 MED ORDER — ALPRAZOLAM 0.5 MG PO TABS: 0.5000 mg | ORAL_TABLET | Freq: Two times a day (BID) | ORAL | Status: DC

## 2016-03-08 MED ORDER — APIXABAN 2.5 MG PO TABS: 2.5000 mg | ORAL_TABLET | Freq: Two times a day (BID) | ORAL | 0 refills | Status: DC

## 2016-03-08 MED ORDER — ACETAMINOPHEN-CODEINE #3 300-30 MG PO TABS: 1.0000 | ORAL_TABLET | ORAL | Status: DC | PRN

## 2016-03-08 MED ORDER — ATORVASTATIN CALCIUM 20 MG PO TABS: 40.0000 mg | ORAL_TABLET | Freq: Every day | ORAL | Status: DC

## 2016-03-08 MED ORDER — PANTOPRAZOLE SODIUM 20 MG PO TBEC
20.0000 mg | DELAYED_RELEASE_TABLET | Freq: Every day | ORAL | Status: DC
  Filled 2016-03-08: qty 1

## 2016-03-08 MED ORDER — PANTOPRAZOLE SODIUM 40 MG PO TBEC: 40.0000 mg | DELAYED_RELEASE_TABLET | Freq: Every day | ORAL | Status: DC

## 2016-03-08 MED ORDER — APIXABAN 2.5 MG PO TABS
2.5000 mg | ORAL_TABLET | Freq: Two times a day (BID) | ORAL | Status: DC
  Administered 2016-03-08: 2.5 mg via ORAL
  Filled 2016-03-08: qty 1

## 2016-03-08 MED ORDER — CLONIDINE HCL 0.1 MG PO TABS: 0.1000 mg | ORAL_TABLET | Freq: Every day | ORAL | Status: DC

## 2016-03-08 MED ORDER — ALPRAZOLAM 0.25 MG PO TABS: 0.2500 mg | ORAL_TABLET | Freq: Every morning | ORAL | Status: DC

## 2016-03-08 MED ORDER — DILTIAZEM HCL ER COATED BEADS 180 MG PO CP24
360.0000 mg | ORAL_CAPSULE | Freq: Every day | ORAL | Status: DC
  Administered 2016-03-08: 360 mg via ORAL
  Filled 2016-03-08: qty 2

## 2016-03-08 MED ORDER — ALPRAZOLAM 0.5 MG PO TABS: 0.5000 mg | ORAL_TABLET | Freq: Every evening | ORAL | Status: DC

## 2016-03-08 MED ORDER — HYDRALAZINE HCL 20 MG/ML IJ SOLN
10.0000 mg | Freq: Once | INTRAMUSCULAR | Status: AC
  Administered 2016-03-08: 10 mg via INTRAVENOUS
  Filled 2016-03-08: qty 1

## 2016-03-08 NOTE — Evaluation (Signed)
Physical Therapy Evaluation Patient Details Name: Melissa Palmer MRN: [id number] DOB: [birth year] Today's Date: 03/08/2016   History of Present Illness  Pt is a 82 yr old female presenting with confusion and hallucinations following a bronchoscopy at [facility] 7/24. Admitted with suspected CVA. PMH significant for dementia, HTN, pacemaker, and h/o TIA.    Clinical Impression  Prior to admission, pt was independent without AD for household mobility and ADLs; "doesn't get out much".  Pt lives with her daughter in a one-story home with 3 STE.  Currently pt is supervision for bed mobility, and CGA for sit <> stand transfers and small bouts of ambulation up to 59f with RW.  Distance was limited by pt feeling nauseous, SOB, and generally unwell (RN notified). Will continue to follow with skilled acute PT to address noted impairments and functional limitations.  Recommend pt discharge home with use of RW, home safety modifications (discussed this with family), and initial supervision for mobility when medically appropriate. Pt would benefit from HHPT to help transition back to PLOF.      Follow Up Recommendations Home health PT;Supervision for mobility/OOB  (initially upon D/C)   Equipment Recommendations  3in1 (PT)    Recommendations for Other Services       Precautions / Restrictions Precautions Precautions: Fall;ICD/Pacemaker Restrictions Weight Bearing Restrictions: No      Mobility  Bed Mobility Overal bed mobility: Needs Assistance Bed Mobility: Supine to Sit;Sit to Supine Supine to sit: Supervision;HOB elevated Sit to supine: Supervision General bed mobility comments: Assumes sitting EOB without difficulty, no physical assist required. Sitting EOB, pt states "I can't breathe I need to lay back down". Pt returns to supine with supervision and vitals screened as detailed below. With supine rest break, pt is able to return back to sitting EOB with supervision and no c/o  SOB.  Transfers Overall transfer level: Needs assistance Equipment used: Rolling walker (2 wheeled) Transfers: Sit to/from Stand (x 4 trials) Sit to Stand: Min guard General transfer comment: Pt able to perform sit <> stand transfers from EOB, recliner, and toilet without physical assist. Vc's for safety/DME use.   Ambulation/Gait Ambulation/Gait assistance: Min guard Ambulation Distance (Feet):  (1 x 336f 1 x 1522f1 x 8ft108fssistive device: Rolling walker (2 wheeled) Gait Pattern/deviations: WFL(Within Functional Limits);Step-through pattern Gait velocity: Decreased General Gait Details: No significant gait deviations or LOB noted. Pt found to be SOB with each ambulation attempt, O2 sats remained > 91% during session as detailed below. Sitting in the recliner, pt was with c/o nausea and needing to have a BM. Pt able to ambulate into the bathroom with RW and CGA and have BM, but upon standing was feeling generally unwell and with request to return to bed. Able to ambulate back to bed with RW and CGA. Further mobility deferred d/t feeling unwell.  Stairs    Wheelchair Mobility    Modified Rankin (Stroke Patients Only)       Balance Overall balance assessment: Needs assistance Sitting-balance support: Feet supported;Bilateral upper extremity supported Sitting balance-Leahy Scale: Good Standing balance support: Bilateral upper extremity supported (on RW) Standing balance-Leahy Scale: Fair      Pertinent Vitals/Pain Pain Assessment: No/denies pain  Vitals screened and noted to be as follows: Supine (rest): 188/60 mmHg, 99 bpm, 95% SaO2 Sitting in chair: 166/52 mmHg, 105 bpm, 91% SaO2 Supine (end of session after ambulation): 196/67 mmHg, 98 bpm, 94% SaO2 (RN in room and aware)     Home Living Family/patient expects to be  discharged to:: Private residence Living Arrangements: Children Available Help at Discharge: Family;Available PRN/intermittently (Can arrange for initial  24/7 ) Type of Home: House Home Access: Stairs to enter Entrance Stairs-Rails: Left Entrance Stairs-Number of Steps: 3 Home Layout: One level Home Equipment: Walker - 4 wheels;Cane - single point      Prior Function Level of Independence: Independent  Comments: Independent with ambulation/ADLs at baseline; doesn't "get out much". Pt with h/o 1 fall in last 6 months (backwards); doesn't use rollator walker because she is fearful it will fall on top of her if she falls again.        Extremity/Trunk Assessment   Upper Extremity Assessment: Defer to OT evaluation   Lower Extremity Assessment: Overall WFL for tasks assessed LE MMT revealing strength >4/5 throughout No deficits in light touch sensation  Cervical / Trunk Assessment: Normal    Communication   Communication: No difficulties  Cognition Arousal/Alertness: Awake/alert Behavior During Therapy: WFL for tasks assessed/performed;Impulsive (fairly impulsive, requires vc's to slow down/wait) Overall Cognitive Status: Within Functional Limits for tasks assessed     General Comments Nursing cleared pt for participation in physical therapy.  Pt agreeable to PT session.  Pt with c/o SOB in sitting, which according to pt/family, happens intermittently at baseline. "I know when I need to lay back down".           Assessment/Plan    PT Assessment Patient needs continued PT services  PT Diagnosis Difficulty walking   PT Problem List Decreased activity tolerance;Decreased balance;Decreased mobility;Decreased knowledge of use of DME;Decreased safety awareness  PT Treatment Interventions DME instruction;Gait training;Stair training;Functional mobility training;Therapeutic activities;Therapeutic exercise;Balance training;Patient/family education   PT Goals (Current goals can be found in the Care Plan section) Acute Rehab PT Goals Patient Stated Goal: To go home PT Goal Formulation: With patient Time For Goal Achievement:  03/22/16 Potential to Achieve Goals: Good    Frequency 7X/week   Barriers to discharge           End of Session Equipment Utilized During Treatment: Gait belt Activity Tolerance: Patient limited by fatigue (and nausea) Patient left: in bed;with bed alarm set;with family/visitor present (personnel in room to transport pt to imaging ) Nurse Communication: Mobility status;Precautions         Time: 9476-5465 PT Time Calculation (min) (ACUTE ONLY): 41 min   Charges:         PT G Codes:        Calvin Chura, SPT 03/08/2016, 12:47 PM

## 2016-03-08 NOTE — Progress Notes (Signed)
   03/08/16 1107  Vitals  Temp 98.2 F (36.8 C)  Temp Source Oral  BP (!) 188/57  MAP (mmHg) 85  BP Location Left Arm  BP Method Automatic  Patient Position (if appropriate) Lying  Pulse Rate 88  Pulse Rate Source Monitor  Resp 18  Oxygen Therapy  SpO2 96 %  O2 Device Room Air   Dr. Benjie Karvonen made aware that pt's BP continues to be elevated.  Dr. Benjie Karvonen to restart pt's home meds.

## 2016-03-08 NOTE — Progress Notes (Signed)
   03/08/16 0745  Vitals  BP (!) 206/72  MAP (mmHg) 104  BP Method Automatic  Pulse Rate 84   Dr. Benjie Karvonen made aware of pt's current BP after PRN hydralazine.

## 2016-03-08 NOTE — Progress Notes (Signed)
[facility] [place] NAME: Melissa Palmer    MR#:  [id number]  DATE OF BIRTH:  1928/07/10  SUBJECTIVE:   Reports that she is back to her baseline she is not confused nor does she have weakness  REVIEW OF SYSTEMS:    Review of Systems  Constitutional: Negative.  Negative for chills, fever and malaise/fatigue.  HENT: Negative.  Negative for ear discharge, ear pain, hearing loss, nosebleeds and sore throat.   Eyes: Negative.  Negative for blurred vision and pain.  Respiratory: Negative.  Negative for cough, hemoptysis, shortness of breath and wheezing.   Cardiovascular: Negative.  Negative for chest pain, palpitations and leg swelling.  Gastrointestinal: Negative.  Negative for abdominal pain, blood in stool, diarrhea, nausea and vomiting.  Genitourinary: Negative.  Negative for dysuria.  Musculoskeletal: Negative.  Negative for back pain.  Skin: Negative.   Neurological: Negative for dizziness, tremors, speech change, focal weakness, seizures and headaches.  Endo/Heme/Allergies: Negative.  Does not bruise/bleed easily.  Psychiatric/Behavioral: Negative.  Negative for depression, hallucinations and suicidal ideas.    Tolerating Diet: yes      DRUG ALLERGIES:   Allergies  Allergen Reactions  . Asa [Aspirin] Anaphylaxis  . Flagyl [Metronidazole] Itching  . Hydrochlorothiazide Itching  . Sulfa Antibiotics Itching    VITALS:  Blood pressure (!) 163/49, pulse 88, temperature 97.7 F (36.5 C), temperature source Oral, resp. rate 18, height '5\' 4"'$  (1.626 m), weight 65.8 kg (145 lb), SpO2 95 %.  PHYSICAL EXAMINATION:   Physical Exam    LABORATORY PANEL:   CBC  Recent Labs Lab 03/07/16 1112  WBC 10.2  HGB 10.4*  HCT 32.3*  PLT 327   ------------------------------------------------------------------------------------------------------------------  Chemistries   Recent Labs Lab 03/07/16 1112  NA 139  K 4.0  CL 104   CO2 24  GLUCOSE 148*  BUN 17  CREATININE 1.19*  CALCIUM 9.5  AST 50*  ALT 29  ALKPHOS 92  BILITOT 0.5   ------------------------------------------------------------------------------------------------------------------  Cardiac Enzymes  Recent Labs Lab 03/07/16 1112  TROPONINI <0.03   ------------------------------------------------------------------------------------------------------------------  RADIOLOGY:  Dg Chest 2 View  Result Date: 03/07/2016 CLINICAL DATA:  Status post bronchoscopy. History of breast carcinoma. Confusion. EXAM: CHEST  2 VIEW COMPARISON:  None. FINDINGS: There is generalized interstitial prominence throughout the mid and lower lung zones. There is no airspace consolidation. There are scattered areas of scarring in the right base. There are clips in each lower lobe region. Heart size and pulmonary vascularity are within normal limits. Pacemaker leads are attached to the right atrium and right ventricle. No adenopathy. There is extensive atherosclerotic calcification in the aorta. No bone lesions are evident. IMPRESSION: Generalized interstitial prominence throughout the mid and lower lung zones. Suspect fibrotic type change, although a degree of interstitial edema superimposed cannot be excluded radiographically. Scarring right base. Cardiac silhouette within normal limits. There is aortic atherosclerosis. No adenopathy evident. Pacemaker leads attached to right atrium and right ventricle. Electronically Signed   By: Lowella Grip III M.D.   On: 03/07/2016 12:42  Ct Head Wo Contrast  Result Date: 03/07/2016 CLINICAL DATA:  Confusion today. Disorientation. Visual hallucinations. EXAM: CT HEAD WITHOUT CONTRAST TECHNIQUE: Contiguous axial images were obtained from the base of the skull through the vertex without intravenous contrast. COMPARISON:  None. FINDINGS: The brain shows generalized atrophy. This is frontal lobe predominant. No evidence of acute  infarction, mass lesion, hemorrhage, hydrocephalus or extra-axial collection. The calvarium is unremarkable. Sinuses, middle ears and  mastoids are clear. There is atherosclerotic calcification of the major vessels at the base of the brain. IMPRESSION: No acute finding by CT. Generalized brain atrophy, most advanced in the frontal lobes. Electronically Signed   By: Nelson Chimes M.D.   On: 03/07/2016 13:16  US Carotid Bilateral  Result Date: 03/07/2016 CLINICAL DATA:  Weakness due to CVA. History of CAD and hypertension EXAM: BILATERAL CAROTID DUPLEX ULTRASOUND TECHNIQUE: Pearline Cables scale imaging, color Doppler and duplex ultrasound were performed of bilateral carotid and vertebral arteries in the neck. COMPARISON:  Head CT- 03/07/2016 FINDINGS: Criteria: Quantification of carotid stenosis is based on velocity parameters that correlate the residual internal carotid diameter with NASCET-based stenosis levels, using the diameter of the distal internal carotid lumen as the denominator for stenosis measurement. The following velocity measurements were obtained: RIGHT ICA:  119/11 cm/sec CCA:  193/79 cm/sec SYSTOLIC ICA/CCA RATIO:  1.4 DIASTOLIC ICA/CCA RATIO:  1.7 ECA:  307 cm/sec LEFT ICA:  78/11 cm/sec CCA:  024/09 cm/sec SYSTOLIC ICA/CCA RATIO:  7.35 DIASTOLIC ICA/CCA RATIO:  1.1 ECA:  115 cm/sec RIGHT CAROTID ARTERY: There is a moderate amount of focal eccentric mixed echogenic plaque involving the origin and proximal aspect of the right common carotid artery (images 3 and 4). There is a moderate to large amount of the center mixed echogenic plaque within the right carotid bulb (images 15 and 17), extending to involve the origin and proximal aspect of the right internal carotid artery (image 25), not resulting in elevated peak systolic velocities within the interrogated course the right internal carotid artery to suggest a hemodynamically significant stenosis. RIGHT VERTEBRAL ARTERY:  Antegrade flow LEFT CAROTID ARTERY:  There is a minimal to moderate amount of eccentric mixed echogenic plaque seen throughout the left common carotid artery (representative images 40, 41 and 45). There is a moderate amount of eccentric mixed echogenic partially shadowing plaque within the left carotid bulb (image 48 and 50), extending to involve the origin and proximal aspect the left internal carotid artery (image 58), not definitely resulting elevated peak systolic velocities within the interrogated course of the left internal carotid artery to suggest a hemodynamically significant stenosis. Borderline elevated peak systolic velocity within distal aspect the left internal carotid artery is felt to be factitiously elevated due to sampling at the periphery of the vessel. LEFT VERTEBRAL ARTERY:  Antegrade Flow IMPRESSION: Moderate amount of bilateral atherosclerotic plaque, right subjectively greater than left, not resulting in a hemodynamically significant stenosis within either internal carotid artery. Electronically Signed   By: Sandi Mariscal M.D.   On: 03/07/2016 18:00    ASSESSMENT AND PLAN:   Melissa Palmer is an 82 y.o. female with a history of TIA and stroke in the past who was taken off of Eliquis for a bronchoscopy on Monday at [facility] presents with confusion.  1. Confusion with right leg weakness which has resolved: Patient's initial symptoms suggestive of TIA/CVA versus effect of anesthesia Repeat CT head today,patient cannot have MRi due to pacemaker. Appreciate neurology consult. Carotid Doppler negative for hemodynamically significant stenosis. Echo shows no cardiac source of emboli.  2. Uncontrolled HTN: Patient with hx of uncontrolled HTN baseline SBP 160-170 Blood pressure may also be slightly elevated more than baseline if patient had CVA. Continue when necessary hydralazine. Restart clonidine which is notorious for causing rebound hypertension.  3. PAF: I have called [facility] to see if I can restart patient on Eliquis,  awaiting callback. A CT head is negative for acute stroke and patient may resume  diltiazem.  4. Depression: Continue Luvox   Management plans discussed with the patient and she is in agreement.  CODE STATUS: full  TOTAL TIME TAKING CARE OF THIS PATIENT: 30 minutes.     POSSIBLE D/C today, DEPENDING ON CLINICAL CONDITION.   Gillie Crisci M.D on 03/08/2016 at 9:38 AM  Between 7am to 6pm - Pager - [phone] After 6pm go to [email or web address] - password [email or web address] [facility]  Office  [phone]  CC: Primary care physician; Denita Lung, DO  Note: This dictation was prepared with Dragon dictation along with smaller phrase technology. Any transcriptional errors that result from this process are unintentional.

## 2016-03-08 NOTE — Progress Notes (Signed)
Subjective: Patient improved today but not yet back to baseline.    Objective: Current vital signs: BP (!) 163/49   Pulse 88   Temp 97.7 F (36.5 C) (Oral)   Resp 18   Ht '5\' 4"'$  (1.626 m)   Wt 65.8 kg (145 lb)   SpO2 95%   BMI 24.89 kg/m  Vital signs in last 24 hours: Temp:  [97 F (36.1 C)-99 F (37.2 C)] 97.7 F (36.5 C) (07/26 0523) Pulse Rate:  [60-89] 88 (07/26 0903) Resp:  [17-22] 18 (07/26 0523) BP: (163-216)/(45-79) 163/49 (07/26 0903) SpO2:  [92 %-98 %] 95 % (07/26 0523) FiO2 (%):  [21 %] 21 % (07/25 1757) Weight:  [65.8 kg (145 lb)] 65.8 kg (145 lb) (07/25 1103)  Intake/Output from previous day: No intake/output data recorded. Intake/Output this shift: Total I/O In: 240 [P.O.:240] Out: -  Nutritional status: Diet regular Room service appropriate? Yes; Fluid consistency: Thin Diet - low sodium heart healthy  Neurologic Exam: Mental Status: Alert. Patient more oriented.  Speech fluent without evidence of aphasia.  Able to follow 3 step commands without difficulty. Cranial Nerves: II: Discs flat bilaterally; Visual fields grossly normal, pupils equal, round, reactive to light and accommodation III,IV, VI: ptosis not present, extra-ocular motions intact bilaterally V,VII: smile symmetric, facial light touch sensation normal bilaterally VIII: hearing normal bilaterally IX,X: gag reflex present XI: bilateral shoulder shrug XII: midline tongue extension Motor: Right :            Upper extremity   5/5                                                                              Left:     Upper extremity   5/5                       Lower extremity   5/5                                                                                                    Lower extremity   5/5 Tone and bulk:normal tone throughout; no atrophy noted  Lab Results: Basic Metabolic Panel:  Recent Labs Lab 03/07/16 1112  NA 139  K 4.0  CL 104  CO2 24  GLUCOSE 148*  BUN 17   CREATININE 1.19*  CALCIUM 9.5    Liver Function Tests:  Recent Labs Lab 03/07/16 1112  AST 50*  ALT 29  ALKPHOS 92  BILITOT 0.5  PROT 7.8  ALBUMIN 4.0   No results for input(s): LIPASE, AMYLASE in the last 168 hours. No results for input(s): AMMONIA in the last 168 hours.  CBC:  Recent Labs Lab 03/07/16 1112  WBC 10.2  HGB 10.4*  HCT 32.3*  MCV 71.8*  PLT  327    Cardiac Enzymes:  Recent Labs Lab 03/07/16 1112  TROPONINI <0.03    Lipid Panel:  Recent Labs Lab 03/08/16 0417  CHOL 211*  TRIG 106  HDL 42  CHOLHDL 5.0  VLDL 21  LDLCALC 148*    CBG: No results for input(s): GLUCAP in the last 168 hours.  Microbiology: Results for orders placed or performed in visit on 05/11/13  Urine culture     Status: None   Collection Time: 05/11/13  5:05 PM  Result Value Ref Range Status   Micro Text Report   Final       SOURCE: CLEAN CATCH    COMMENT                   MIXED BACTERIAL ORGANISMS   COMMENT                   RESULTS SUGGESTIVE OF CONTAMINATION   ANTIBIOTIC                                                        Coagulation Studies: No results for input(s): LABPROT, INR in the last 72 hours.  Imaging: Dg Chest 2 View  Result Date: 03/07/2016 CLINICAL DATA:  Status post bronchoscopy. History of breast carcinoma. Confusion. EXAM: CHEST  2 VIEW COMPARISON:  None. FINDINGS: There is generalized interstitial prominence throughout the mid and lower lung zones. There is no airspace consolidation. There are scattered areas of scarring in the right base. There are clips in each lower lobe region. Heart size and pulmonary vascularity are within normal limits. Pacemaker leads are attached to the right atrium and right ventricle. No adenopathy. There is extensive atherosclerotic calcification in the aorta. No bone lesions are evident. IMPRESSION: Generalized interstitial prominence throughout the mid and lower lung zones. Suspect fibrotic type change,  although a degree of interstitial edema superimposed cannot be excluded radiographically. Scarring right base. Cardiac silhouette within normal limits. There is aortic atherosclerosis. No adenopathy evident. Pacemaker leads attached to right atrium and right ventricle. Electronically Signed   By: Lowella Grip III M.D.   On: 03/07/2016 12:42  Ct Head Wo Contrast  Result Date: 03/07/2016 CLINICAL DATA:  Confusion today. Disorientation. Visual hallucinations. EXAM: CT HEAD WITHOUT CONTRAST TECHNIQUE: Contiguous axial images were obtained from the base of the skull through the vertex without intravenous contrast. COMPARISON:  None. FINDINGS: The brain shows generalized atrophy. This is frontal lobe predominant. No evidence of acute infarction, mass lesion, hemorrhage, hydrocephalus or extra-axial collection. The calvarium is unremarkable. Sinuses, middle ears and mastoids are clear. There is atherosclerotic calcification of the major vessels at the base of the brain. IMPRESSION: No acute finding by CT. Generalized brain atrophy, most advanced in the frontal lobes. Electronically Signed   By: Nelson Chimes M.D.   On: 03/07/2016 13:16  US Carotid Bilateral  Result Date: 03/07/2016 CLINICAL DATA:  Weakness due to CVA. History of CAD and hypertension EXAM: BILATERAL CAROTID DUPLEX ULTRASOUND TECHNIQUE: Pearline Cables scale imaging, color Doppler and duplex ultrasound were performed of bilateral carotid and vertebral arteries in the neck. COMPARISON:  Head CT- 03/07/2016 FINDINGS: Criteria: Quantification of carotid stenosis is based on velocity parameters that correlate the residual internal carotid diameter with NASCET-based stenosis levels, using the diameter of the distal internal carotid lumen as the  denominator for stenosis measurement. The following velocity measurements were obtained: RIGHT ICA:  119/11 cm/sec CCA:  353/29 cm/sec SYSTOLIC ICA/CCA RATIO:  1.4 DIASTOLIC ICA/CCA RATIO:  1.7 ECA:  307 cm/sec LEFT ICA:   78/11 cm/sec CCA:  924/26 cm/sec SYSTOLIC ICA/CCA RATIO:  8.34 DIASTOLIC ICA/CCA RATIO:  1.1 ECA:  115 cm/sec RIGHT CAROTID ARTERY: There is a moderate amount of focal eccentric mixed echogenic plaque involving the origin and proximal aspect of the right common carotid artery (images 3 and 4). There is a moderate to large amount of the center mixed echogenic plaque within the right carotid bulb (images 15 and 17), extending to involve the origin and proximal aspect of the right internal carotid artery (image 25), not resulting in elevated peak systolic velocities within the interrogated course the right internal carotid artery to suggest a hemodynamically significant stenosis. RIGHT VERTEBRAL ARTERY:  Antegrade flow LEFT CAROTID ARTERY: There is a minimal to moderate amount of eccentric mixed echogenic plaque seen throughout the left common carotid artery (representative images 40, 41 and 45). There is a moderate amount of eccentric mixed echogenic partially shadowing plaque within the left carotid bulb (image 48 and 50), extending to involve the origin and proximal aspect the left internal carotid artery (image 58), not definitely resulting elevated peak systolic velocities within the interrogated course of the left internal carotid artery to suggest a hemodynamically significant stenosis. Borderline elevated peak systolic velocity within distal aspect the left internal carotid artery is felt to be factitiously elevated due to sampling at the periphery of the vessel. LEFT VERTEBRAL ARTERY:  Antegrade Flow IMPRESSION: Moderate amount of bilateral atherosclerotic plaque, right subjectively greater than left, not resulting in a hemodynamically significant stenosis within either internal carotid artery. Electronically Signed   By: Sandi Mariscal M.D.   On: 03/07/2016 18:00   Medications:  I have reviewed the patient's current medications. Scheduled: . apixaban  2.5 mg Oral BID  . cloNIDine  0.1 mg Oral QHS     Assessment/Plan: Patient improved but still tangential.  Carotid dopplers show no evidence of hemodynamically significant stenosis.  Echocardiogram shows no cardiac source of emboli.  A1c pending, LDL 148.  Patient has been cleared to restart her Eliquis.     Recommendations: 1.  Agree with restarting Eliquis 2.  Statin for lipid management with target LDL<70 3.  Continued Clonidine for BP control 4.  PT/OT to evaluate    LOS: 0 days   Alexis Goodell, MD Neurology [phone] 03/08/2016  10:25 AM

## 2016-03-08 NOTE — Discharge Summary (Addendum)
[facility] [place] NAME: Melissa Palmer    MR#:  [id number]  DATE OF BIRTH:  1928/01/18  DATE OF ADMISSION:  03/07/2016 ADMITTING PHYSICIAN: Max Sane, MD  DATE OF DISCHARGE: 03/08/2016  PRIMARY CARE PHYSICIAN: Denita Lung, DO    ADMISSION DIAGNOSIS:  CVA (cerebral infarction) [I63.9] Weakness due to cerebrovascular accident [I69.898, R53.1] Cerebrovascular accident (CVA) due to thrombosis of cerebral artery ([place]) [I63.30]  DISCHARGE DIAGNOSIS:  Active Problems:   Stroke (cerebrum) ([place])   SECONDARY DIAGNOSIS:   Past Medical History:  Diagnosis Date  . Cancer ([place])   . Cataract   . Dementia   . Hypertension   . TIA (transient ischemic attack)     HOSPITAL COURSE:   Tamee Battin an 82 y.o.femalewith a history of TIA and stroke in the past who was taken off of Eliquis for a bronchoscopy on Monday at [facility] presents with confusion.  1. Confusion with right leg weakness which has resolved: Patient's initial symptoms suggestive of CVA As per neurology consultation.  She could not undergo MRI as she has a pacemaker. Carotid Doppler negative for hemodynamically significant stenosis. Echo shows no cardiac source of emboli. She will resume Eliquis. I spoke with Dr. Girard Cooter at [facility] regarding this medication.   2. Uncontrolled HTN: Patient with hx of uncontrolled HTN baseline SBP 160-170 Blood pressure may also be slightly elevated more than baseline due to CVA. Continue when necessary hydralazine. Restart clonidine which is notorious for causing rebound hypertension.  3. PAF: I spoke with Dr. Girard Cooter who performed bronchoscopy on patient at [facility]. Patient may resume her anticoagulation. It should be noted that patient's dose of anticoagulation is 2.5 twice a day which is her home dose. She would actually benefit from 5 mg twice a day. We could find no documentation as to why she was on 2.5 mg by mouth twice a day. She follows  closely with her cardiologist and I suggest that this be discussed with cardiology.  She may also resume diltiazem.  4. Depression: Continue Luvox   DISCHARGE CONDITIONS AND DIET:   Stable for discharge on heart healthy diet.  CONSULTS OBTAINED:  Treatment Team:  Catarina Hartshorn, MD Alexis Goodell, MD  DRUG ALLERGIES:   Allergies  Allergen Reactions  . Asa [Aspirin] Anaphylaxis  . Flagyl [Metronidazole] Itching  . Hydrochlorothiazide Itching  . Sulfa Antibiotics Itching    DISCHARGE MEDICATIONS:   Current Discharge Medication List    START taking these medications   Details  apixaban (ELIQUIS) 2.5 MG TABS tablet Take 1 tablet (2.5 mg total) by mouth 2 (two) times daily. Qty: 60 tablet, Refills: 0    Atorvastatin 40 mg daily    CONTINUE these medications which have NOT CHANGED   Details  acetaminophen-codeine (TYLENOL #3) 300-30 MG tablet Take 1 tablet by mouth every 4 (four) hours as needed for moderate pain or severe pain.     ALPRAZolam (XANAX) 0.5 MG tablet Take 0.5-1 mg by mouth 2 (two) times daily. Take 0.'25mg'$  in morning and take 0.'5mg'$  at night    cloNIDine (CATAPRES) 0.1 MG tablet Take 0.1 mg by mouth at bedtime.    diltiazem (CARDIZEM CD) 360 MG 24 hr capsule Take 360 mg by mouth daily.    fluvoxaMINE (LUVOX) 50 MG tablet Take 25 mg by mouth daily at 12 noon.    hydrOXYzine (ATARAX/VISTARIL) 10 MG tablet Take 10 mg by mouth at bedtime.    pantoprazole (PROTONIX) 20 MG tablet Take 20 mg  by mouth daily.              Today   CHIEF COMPLAINT:   Patient doing well this morning. She is back at her baseline. No weakness or confusion.   VITAL SIGNS:  Blood pressure (!) 188/57, pulse 88, temperature 98.2 F (36.8 C), temperature source Oral, resp. rate 18, height '5\' 4"'$  (1.626 m), weight 65.8 kg (145 lb), SpO2 96 %.   REVIEW OF SYSTEMS:  Review of Systems  Constitutional: Negative.  Negative for chills, fever and malaise/fatigue.  HENT:  Negative.  Negative for ear discharge, ear pain, hearing loss, nosebleeds and sore throat.   Eyes: Negative.  Negative for blurred vision and pain.  Respiratory: Negative.  Negative for cough, hemoptysis, shortness of breath and wheezing.   Cardiovascular: Negative.  Negative for chest pain, palpitations and leg swelling.  Gastrointestinal: Negative.  Negative for abdominal pain, blood in stool, diarrhea, nausea and vomiting.  Genitourinary: Negative.  Negative for dysuria.  Musculoskeletal: Negative.  Negative for back pain.  Skin: Negative.   Neurological: Negative for dizziness, tremors, speech change, focal weakness, seizures and headaches.  Endo/Heme/Allergies: Negative.  Does not bruise/bleed easily.  Psychiatric/Behavioral: Negative.  Negative for depression, hallucinations and suicidal ideas.     PHYSICAL EXAMINATION:  GENERAL:  82 y.o.-year-old patient lying in the bed with no acute distress.  NECK:  Supple, no jugular venous distention. No thyroid enlargement, no tenderness.  LUNGS: Normal breath sounds bilaterally, no wheezing, rales,rhonchi  No use of accessory muscles of respiration.  CARDIOVASCULAR: irr, irr. No murmurs, rubs, or gallops.  ABDOMEN: Soft, non-tender, non-distended. Bowel sounds present. No organomegaly or mass.  EXTREMITIES: No pedal edema, cyanosis, or clubbing.  PSYCHIATRIC: The patient is alert and oriented x 3.  SKIN: No obvious rash, lesion, or ulcer.   DATA REVIEW:   CBC  Recent Labs Lab 03/07/16 1112  WBC 10.2  HGB 10.4*  HCT 32.3*  PLT 327    Chemistries   Recent Labs Lab 03/07/16 1112  NA 139  K 4.0  CL 104  CO2 24  GLUCOSE 148*  BUN 17  CREATININE 1.19*  CALCIUM 9.5  AST 50*  ALT 29  ALKPHOS 92  BILITOT 0.5    Cardiac Enzymes  Recent Labs Lab 03/07/16 1112  TROPONINI <0.03    Microbiology Results  '@[id number]'$ @  RADIOLOGY:  Dg Chest 2 View  Result Date: 03/07/2016 CLINICAL DATA:  Status post bronchoscopy.  History of breast carcinoma. Confusion. EXAM: CHEST  2 VIEW COMPARISON:  None. FINDINGS: There is generalized interstitial prominence throughout the mid and lower lung zones. There is no airspace consolidation. There are scattered areas of scarring in the right base. There are clips in each lower lobe region. Heart size and pulmonary vascularity are within normal limits. Pacemaker leads are attached to the right atrium and right ventricle. No adenopathy. There is extensive atherosclerotic calcification in the aorta. No bone lesions are evident. IMPRESSION: Generalized interstitial prominence throughout the mid and lower lung zones. Suspect fibrotic type change, although a degree of interstitial edema superimposed cannot be excluded radiographically. Scarring right base. Cardiac silhouette within normal limits. There is aortic atherosclerosis. No adenopathy evident. Pacemaker leads attached to right atrium and right ventricle. Electronically Signed   By: Lowella Grip III M.D.   On: 03/07/2016 12:42  Ct Head Wo Contrast  Result Date: 03/07/2016 CLINICAL DATA:  Confusion today. Disorientation. Visual hallucinations. EXAM: CT HEAD WITHOUT CONTRAST TECHNIQUE: Contiguous axial images were obtained from the base  of the skull through the vertex without intravenous contrast. COMPARISON:  None. FINDINGS: The brain shows generalized atrophy. This is frontal lobe predominant. No evidence of acute infarction, mass lesion, hemorrhage, hydrocephalus or extra-axial collection. The calvarium is unremarkable. Sinuses, middle ears and mastoids are clear. There is atherosclerotic calcification of the major vessels at the base of the brain. IMPRESSION: No acute finding by CT. Generalized brain atrophy, most advanced in the frontal lobes. Electronically Signed   By: Nelson Chimes M.D.   On: 03/07/2016 13:16  US Carotid Bilateral  Result Date: 03/07/2016 CLINICAL DATA:  Weakness due to CVA. History of CAD and hypertension  EXAM: BILATERAL CAROTID DUPLEX ULTRASOUND TECHNIQUE: Pearline Cables scale imaging, color Doppler and duplex ultrasound were performed of bilateral carotid and vertebral arteries in the neck. COMPARISON:  Head CT- 03/07/2016 FINDINGS: Criteria: Quantification of carotid stenosis is based on velocity parameters that correlate the residual internal carotid diameter with NASCET-based stenosis levels, using the diameter of the distal internal carotid lumen as the denominator for stenosis measurement. The following velocity measurements were obtained: RIGHT ICA:  119/11 cm/sec CCA:  163/84 cm/sec SYSTOLIC ICA/CCA RATIO:  1.4 DIASTOLIC ICA/CCA RATIO:  1.7 ECA:  307 cm/sec LEFT ICA:  78/11 cm/sec CCA:  536/46 cm/sec SYSTOLIC ICA/CCA RATIO:  8.03 DIASTOLIC ICA/CCA RATIO:  1.1 ECA:  115 cm/sec RIGHT CAROTID ARTERY: There is a moderate amount of focal eccentric mixed echogenic plaque involving the origin and proximal aspect of the right common carotid artery (images 3 and 4). There is a moderate to large amount of the center mixed echogenic plaque within the right carotid bulb (images 15 and 17), extending to involve the origin and proximal aspect of the right internal carotid artery (image 25), not resulting in elevated peak systolic velocities within the interrogated course the right internal carotid artery to suggest a hemodynamically significant stenosis. RIGHT VERTEBRAL ARTERY:  Antegrade flow LEFT CAROTID ARTERY: There is a minimal to moderate amount of eccentric mixed echogenic plaque seen throughout the left common carotid artery (representative images 40, 41 and 45). There is a moderate amount of eccentric mixed echogenic partially shadowing plaque within the left carotid bulb (image 48 and 50), extending to involve the origin and proximal aspect the left internal carotid artery (image 58), not definitely resulting elevated peak systolic velocities within the interrogated course of the left internal carotid artery to suggest a  hemodynamically significant stenosis. Borderline elevated peak systolic velocity within distal aspect the left internal carotid artery is felt to be factitiously elevated due to sampling at the periphery of the vessel. LEFT VERTEBRAL ARTERY:  Antegrade Flow IMPRESSION: Moderate amount of bilateral atherosclerotic plaque, right subjectively greater than left, not resulting in a hemodynamically significant stenosis within either internal carotid artery. Electronically Signed   By: Sandi Mariscal M.D.   On: 03/07/2016 18:00     Management plans discussed with the patient and she is in agreement. Stable for discharge home  Patient should follow up with PCP in 1 week  [facility] in 1 week  CODE STATUS:     Code Status Orders        Start     Ordered   03/07/16 1757  Full code  Continuous     03/07/16 1756    Code Status History    Date Active Date Inactive Code Status Order ID Comments User Context   03/07/2016  5:57 PM 03/07/2016  7:05 PM Full Code [id number]  Max Sane, MD Inpatient  TOTAL TIME TAKING CARE OF THIS PATIENT: 35 minutes.    Note: This dictation was prepared with Dragon dictation along with smaller phrase technology. Any transcriptional errors that result from this process are unintentional.  Taura Lamarre M.D on 03/08/2016 at 11:22 AM  Between 7am to 6pm - Pager - [phone] After 6pm go to [email or web address] - password [email or web address] [facility]  Office  [phone]  CC: Primary care physician; Denita Lung, DO

## 2016-03-08 NOTE — Care Management (Signed)
Admitted to [facility] with the diagnosis stroke. Lives with daughter, Kingsley Plan [phone]). Last seen Dr, Caryl Comes in March. Uses a rolling walker as needed in the home. Home Health through [facility] in 2009. No skilled nursing. No home oxygen. Last fall was 2 months ago. Good appetite/ Shower chair in [employer], Takes care of feeding and dressing. Needs help with baths at times. Prescriptions are filled at [facility] in [place]. Daughter will transport. Physical therapy evaluation completed. Recommends home with home health and therapy. [place]. Will update Floydene Flock, [facility] representative. Discharge to home today per Dr. Mirna Mires RN MSN CCM Care Management [phone]

## 2016-03-08 NOTE — Evaluation (Signed)
Occupational Therapy Evaluation Patient Details Name: Melissa Palmer MRN: [id number] DOB: 1928-02-17 Today's Date: 03/08/2016    History of Present Illness Pt is a 82 yr old female presenting with confusion and hallucinations following a bronchoscopy at [facility] 7/24. Admitted with suspected CVA. PMH significant for dementia, HTN, pacemaker, and h/o TIA.   Clinical Impression   Pt is 82 year old female who presents with confusion, hallucinations following bronchoscopy at [facility] on 7/24.  Pt presents with a flat affect and feeling light headed when moving. Pt is able to complete self care skills except LB bathing and dressing which need min assist and cues for safety.  Pt has full AROM of BUEs and hands.  Coordination is intact but slow and requires extra time. Patient and family would like to go home.  Pt's daughter is home this week but returns to work next Monday and pt will be home alone. Discussed this with SW Hassan Rowan and rec a BSC to prevent falls when getting up at night.  Rec continued OT while in hospital to continue to work on increasing independence in ADLs, balance and functional mobility training, coordination exercises and family ed and training.     Follow Up Recommendations  No OT follow up    Equipment Recommendations  3 in 1 bedside comode    Recommendations for Other Services       Precautions / Restrictions Precautions Precautions: Fall;ICD/Pacemaker Restrictions Weight Bearing Restrictions: No      Mobility Bed Mobility Overal bed mobility: Needs Assistance Bed Mobility: Supine to Sit;Sit to Supine     Supine to sit: Supervision;HOB elevated Sit to supine: Supervision   General bed mobility comments: Assumes sitting EOB without difficulty, no physical assist required. Sitting EOB, pt states "I can't breathe I need to lay back down". Pt returns to supine with supervision and vitals screened as detailed below. With supine rest break, pt is able to return back to sitting  EOB with supervision and no c/o SOB.  Transfers Overall transfer level: Needs assistance Equipment used: Rolling walker (2 wheeled) Transfers: Sit to/from Stand (x 4 trials) Sit to Stand: Min guard         General transfer comment: Pt able to perform sit <> stand transfers from EOB, recliner, and toilet without physical assist. Vc's for safety/DME use.     Balance Overall balance assessment: Needs assistance Sitting-balance support: Feet supported;Bilateral upper extremity supported Sitting balance-Leahy Scale: Good     Standing balance support: Bilateral upper extremity supported (on RW) Standing balance-Leahy Scale: Fair                              ADL Overall ADL's : Needs assistance/impaired Eating/Feeding: Independent;Set up   Grooming: Wash/dry hands;Wash/dry face;Oral care;Applying deodorant;Brushing hair;Independent;Set up   Upper Body Bathing: Independent;Set up   Lower Body Bathing: Minimal assistance;Set up;Supervison/ safety   Upper Body Dressing : Independent;Set up   Lower Body Dressing: Minimal assistance;Supervision/safety;Set up   Toilet Transfer: Supervision/safety   Toileting- Clothing Manipulation and Hygiene: Supervision/safety         General ADL Comments: Pt is able to complete most self care but needs extra time and supervi;sion and min assist for LB bathing and dressing for knees and below.  Pt is at risk of fallls and rec a BSC for use at night to prevent falls since bathroom is down the hall and pt's daughter reports she gets confused at night when getting  up.  Spoke to SW [place] about rec and family agreed with rec.     Vision     Perception     Praxis      Pertinent Vitals/Pain Pain Assessment: No/denies pain     Hand Dominance Right   Extremity/Trunk Assessment Upper Extremity Assessment Upper Extremity Assessment: Overall WFL for tasks assessed   Lower Extremity Assessment Lower Extremity Assessment: Overall  WFL for tasks assessed   Cervical / Trunk Assessment Cervical / Trunk Assessment: Normal   Communication Communication Communication: No difficulties   Cognition Arousal/Alertness: Awake/alert Behavior During Therapy: WFL for tasks assessed/performed;Impulsive Overall Cognitive Status: Within Functional Limits for tasks assessed                     General Comments       Exercises       Shoulder Instructions      Home Living Family/patient expects to be discharged to:: Private residence Living Arrangements: Children (daughter only has off work until Monday and then pt will be home alone for most of day) Available Help at Discharge: Family;Available PRN/intermittently Type of Home: House Home Access: Stairs to enter [employer] of Steps: 3 Entrance Stairs-Rails: Left Home Layout: One level     Bathroom Shower/Tub: Walk-in shower         Home Equipment: Environmental consultant - 4 wheels;Cane - single point          Prior Functioning/Environment Level of Independence: Independent        Comments: Independent with ambulation/ADLs at baseline; doesn't "get out much". Pt with h/o 1 fall in last 6 months (backwards); doesn't use rollator walker because she is fearful it will fall on top of her if she falls again.    OT Diagnosis: Generalized weakness   OT Problem List: Decreased strength;Decreased range of motion;Decreased activity tolerance;Impaired balance (sitting and/or standing)   OT Treatment/Interventions: Self-care/ADL training;Therapeutic exercise;Therapeutic activities    OT Goals(Current goals can be found in the care plan section) Acute Rehab OT Goals Patient Stated Goal: To go home OT Goal Formulation: With patient/family Time For Goal Achievement: 03/22/16 Potential to Achieve Goals: Good ADL Goals Pt Will Perform Lower Body Dressing: Independently;with set-up;sit to/from stand Pt Will Transfer to Toilet: Independently;bedside commode  OT  Frequency: Min 1X/week   Barriers to D/C:            Co-evaluation              End of Session    Activity Tolerance: Patient limited by fatigue Patient left: in bed;with call bell/phone within reach;with bed alarm set;with family/visitor present   Time: 1345-1425 OT Time Calculation (min): 40 min Charges:  OT General Charges $OT Visit: 1 Procedure OT Evaluation $OT Eval Moderate Complexity: 1 Procedure OT Treatments $Self Care/Home Management : 23-37 mins G-Codes: OT G-codes **NOT FOR INPATIENT CLASS** Functional Limitation: Self care Self Care Current Status (G4010): At least 1 percent but less than 20 percent impaired, limited or restricted Self Care Goal Status (U7253): 0 percent impaired, limited or restricted   Chrys Racer, OTR/L ascom [phone] 03/08/2016, 2:38 PM

## 2016-03-08 NOTE — Progress Notes (Signed)
Discussed discharge instructions and medications with pt and her daughter.  IV removed.  Addressed all questions.  Pt transported home via car by daughter.  Clarise Cruz, RN

## 2016-03-08 NOTE — Evaluation (Signed)
Clinical/Bedside Swallow Evaluation Patient Details  Name: Melissa Palmer MRN: [id number] Date of Birth: 12-21-1927  Today's Date: 03/08/2016 Time: SLP Start Time (ACUTE ONLY): 1030 SLP Stop Time (ACUTE ONLY): 1130 SLP Time Calculation (min) (ACUTE ONLY): 60 min  Past Medical History:  Past Medical History:  Diagnosis Date  . Cancer ([place])   . Cataract   . Dementia   . Hypertension   . TIA (transient ischemic attack)    Past Surgical History:  Past Surgical History:  Procedure Laterality Date  . BREAST SURGERY    . COLONOSCOPY    . HYSTEROTOMY    . UPPER GI ENDOSCOPY     HPI:  Pt is a 82 y.o. female with a known history of TIA and 2 strokes in the past, Dementia, CA, HTN who was taken off of Eliquis for a bronchoscopy at [facility] Monday. Afterward returned home and as sedation wore off appeared to return to baseline having normal conversation with family members until going to bed. This AM awakened at about 0400.  Had to go to the bathroom repeatedly and required assistance due to complaints that her Rt leg was not working correctly.  Was also noted by family to be confused/Hallucinating. Remains off Eliquis. Patient with a history of pacemaker and PAF. Currently, alert/oriented x3; follows instructions appropriately. Pt conversing w/ others w/ adequate articulation of speech - edentulous status. Family feels close to her baseline overall.    Assessment / Plan / Recommendation Clinical Impression  Pt appears at her baseline for swallowing w/ adequate oropharyngeal phase swallowing exhibited but w/ suspected Esophageal dysmotility (as baseline for pt per report). Pt consumed po trials of thin liquids and soft solids w/ no overt s/s of aspiration noted. Pt does tended to take larger bites of foods (and eats some solids w/ less mastication per Dtr) despite her edentulous status and inability to fully and effectively masticate solids. No other oral phase deficits noted. Thorough education was given  on diet/food consistencies and food prep and eating behaviors when edentulous. Education given on aspiration precautions as well. Pt endorsed behaviors she could change; family stated they will monitor more closely. Recommend a Dysphagia 3-type diet consistency w/ moistened foods; thin liquids - no straw if increased coughing noted. Recommend Reflux precautions and f/u w/ GI as indicated. NSG to reconsult if any change in status while admitted. Pt/family agreed w/ eval results and poc.     Aspiration Risk   (reduced following precautions)    Diet Recommendation  Dysphagia 3 foods(moisted and cut small d/t edentulous status); thin liquids. General aspiration and Reflux precautions.   Medication Administration: Whole meds with puree (for easier swallowing as needed)    Other  Recommendations Recommended Consults: Consider GI evaluation (Dietician) Oral Care Recommendations: Oral care BID;Staff/trained caregiver to provide oral care   Follow up Recommendations  None    Frequency and Duration            Prognosis Prognosis for Safe Diet Advancement: Good Barriers to Reach Goals: Cognitive deficits      Swallow Study   General Date of Onset: 03/07/16 HPI: Pt is a 82 y.o. female with a known history of TIA and 2 strokes in the past, Dementia, CA, HTN who was taken off of Eliquis for a bronchoscopy at [facility] Monday. Afterward returned home and as sedation wore off appeared to return to baseline having normal conversation with family members until going to bed. This AM awakened at about 0400.  Had to go to  the bathroom repeatedly and required assistance due to complaints that her Rt leg was not working correctly.  Was also noted by family to be confused/Hallucinating. Remains off Eliquis. Patient with a history of pacemaker and PAF. Currently, alert/oriented x3; follows instructions appropriately. Pt conversing w/ others w/ adequate articulation of speech - edentulous status. Family feels close to her  baseline overall.  Type of Study: Bedside Swallow Evaluation Previous Swallow Assessment: none indicated Diet Prior to this Study: Regular;Thin liquids ("some" softer foods ) Temperature Spikes Noted: No (wbc not elevated) Respiratory Status: Room air History of Recent Intubation: No Behavior/Cognition: Alert;Cooperative;Pleasant mood Oral Cavity Assessment: Within Functional Limits Oral Care Completed by SLP: Recent completion by staff Oral Cavity - Dentition: Edentulous Vision: Functional for self-feeding Self-Feeding Abilities: Able to feed self;Needs set up Patient Positioning: Upright in bed Baseline Vocal Quality: Normal Volitional Cough: Strong Volitional Swallow: Able to elicit    Oral/Motor/Sensory Function Overall Oral Motor/Sensory Function: Within functional limits   Ice Chips Ice chips: Not tested   Thin Liquid Thin Liquid: Within functional limits Presentation: Cup;Straw (4-5 trials w/ each)    Nectar Thick Nectar Thick Liquid: Not tested   Honey Thick Honey Thick Liquid: Not tested   Puree Puree: Within functional limits Presentation: Self Fed;Spoon (6+ trials)   Solid   GO   Solid: Within functional limits (softened solids, moistened well) Presentation: Self Fed;Spoon (6+ trials) Other Comments: tended to take larger bites    Functional Assessment Tool Used: clinical judgement Functional Limitations: Swallowing Swallow Current Status (O2703): At least 1 percent but less than 20 percent impaired, limited or restricted Swallow Goal Status [phone]): At least 1 percent but less than 20 percent impaired, limited or restricted Swallow Discharge Status [phone]): At least 1 percent but less than 20 percent impaired, limited or restricted     Orinda Kenner, MS, CCC-SLP  [place] 03/08/2016,4:52 PM

## 2016-03-09 LAB — HEMOGLOBIN A1C: Hgb A1c MFr Bld: 5.7 % (ref 4.0–6.0)

## 2016-03-17 ENCOUNTER — Ambulatory Visit
Admission: RE | Admit: 2016-03-17 | Discharge: 2016-03-17 | Disposition: A | Discharge status: Home / Self Care | Payer: Medicare HMO | Source: Ambulatory Visit | Attending: Radiation Oncology | Admitting: Radiation Oncology

## 2016-03-17 VITALS — BP 226/84 | HR 97 | Temp 98.2°F | Ht 64.0 in | Wt 141.1 lb

## 2016-03-17 DIAGNOSIS — Z87891 Personal history of nicotine dependence: Secondary | ICD-10-CM | POA: Insufficient documentation

## 2016-03-17 DIAGNOSIS — Z923 Personal history of irradiation: Secondary | ICD-10-CM | POA: Diagnosis not present

## 2016-03-17 DIAGNOSIS — Z510 Encounter for antineoplastic radiation therapy: Secondary | ICD-10-CM | POA: Insufficient documentation

## 2016-03-17 DIAGNOSIS — I10 Essential (primary) hypertension: Secondary | ICD-10-CM | POA: Insufficient documentation

## 2016-03-17 DIAGNOSIS — Z8673 Personal history of transient ischemic attack (TIA), and cerebral infarction without residual deficits: Secondary | ICD-10-CM | POA: Diagnosis not present

## 2016-03-17 DIAGNOSIS — F0390 Unspecified dementia without behavioral disturbance: Secondary | ICD-10-CM | POA: Insufficient documentation

## 2016-03-17 DIAGNOSIS — C3432 Malignant neoplasm of lower lobe, left bronchus or lung: Secondary | ICD-10-CM | POA: Insufficient documentation

## 2016-03-17 DIAGNOSIS — Z803 Family history of malignant neoplasm of breast: Secondary | ICD-10-CM | POA: Diagnosis not present

## 2016-03-17 DIAGNOSIS — Z7901 Long term (current) use of anticoagulants: Secondary | ICD-10-CM | POA: Insufficient documentation

## 2016-03-17 DIAGNOSIS — Z79899 Other long term (current) drug therapy: Secondary | ICD-10-CM | POA: Diagnosis not present

## 2016-03-17 DIAGNOSIS — I4891 Unspecified atrial fibrillation: Secondary | ICD-10-CM | POA: Insufficient documentation

## 2016-03-17 DIAGNOSIS — C3490 Malignant neoplasm of unspecified part of unspecified bronchus or lung: Secondary | ICD-10-CM

## 2016-03-17 NOTE — Consult Note (Signed)
Except an outstanding is perfect of Radiation Oncology NEW PATIENT EVALUATION  Name: Melissa Palmer  MRN: [id number]  Date:   03/17/2016     DOB: 19-Aug-1927   This 82 y.o. 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  REFERRING PHYSICIAN: Denita Lung, DO  CHIEF COMPLAINT:  Chief Complaint  Patient presents with  . Lung Cancer    DIAGNOSIS: The encounter diagnosis was Squamous cell carcinoma lung, unspecified laterality ([place]).   PREVIOUS INVESTIGATIONS:  CT scans reviewed Pathology report reviewed Clinical notes reviewed  HPI: Patient is a 82 year old female with a history of CVA, sick sinus syndrome with pacemaker placement in her right anterior chest atrial fibrillation. She presented to her PMD for a PET stress test ordered by her cardiologist for chest pain and shortness of breath. She was noted to have a 4 cm lobulated left lower lobe mass. There is also a 1 cm peripheral nodule in the medial left lower lobe as well as a 1.4 cm nodule in the right lower lobe. Patient underwent navigational bronchoscopy which was positive for squamous cell carcinoma. Multiple mediastinal lymph nodes were biopsied all negative for metastatic disease. Patient also has a history of breast cancer status post adjuvant radiation therapy to her right breast back in 1999. She was seen at [facility] recognition was made for radiation therapy with curative intent. She self-referred to [facility] which is closer to her home in [place]. She has a slight productive cough recently been put on antibiotic therapy yesterday. She also complains of remote history of back surgery which is cause some problems with swallowing over the course of her lifetime. Her weight is stable. She specifically denies hemoptysis or bone pain.  PLANNED TREATMENT REGIMEN: I MRT radiation therapy to left lower lobe lesion  PAST MEDICAL HISTORY:   has a past medical history of Cancer ([place]); Cataract; Dementia; Hypertension; and TIA (transient ischemic attack).    PAST SURGICAL HISTORY:  Past Surgical History:  Procedure Laterality Date  . BREAST SURGERY    . COLONOSCOPY    . HYSTEROTOMY    . UPPER GI ENDOSCOPY      FAMILY HISTORY: family history is not on file.  SOCIAL HISTORY:  reports that she has quit smoking. She has never used smokeless tobacco. She reports that she does not drink alcohol or use drugs.  ALLERGIES: Asa [aspirin]; Flagyl [metronidazole]; Hydrochlorothiazide; and Sulfa antibiotics  MEDICATIONS:  Current Outpatient Prescriptions  Medication Sig Dispense Refill  . acetaminophen-codeine (TYLENOL #3) 300-30 MG tablet Take 1 tablet by mouth every 4 (four) hours as needed for moderate pain or severe pain.     Marland Kitchen ALPRAZolam (XANAX) 0.5 MG tablet Take 0.5-1 mg by mouth 2 (two) times daily. Take 0.'25mg'$  in morning and take 0.'5mg'$  at night    . amoxicillin-clavulanate (AUGMENTIN) 875-125 MG tablet     . apixaban (ELIQUIS) 2.5 MG TABS tablet Take 1 tablet (2.5 mg total) by mouth 2 (two) times daily. 60 tablet 0  . azithromycin (ZITHROMAX) 500 MG tablet     . cloNIDine (CATAPRES) 0.1 MG tablet Take 0.1 mg by mouth at bedtime.    Marland Kitchen diltiazem (CARDIZEM CD) 360 MG 24 hr capsule Take 360 mg by mouth daily.    . fluvoxaMINE (LUVOX) 50 MG tablet Take 25 mg by mouth daily at 12 noon.    . hydrOXYzine (ATARAX/VISTARIL) 10 MG tablet Take 10 mg by mouth  at bedtime.    . pantoprazole (PROTONIX) 20 MG tablet Take 20 mg by mouth daily.     Current Facility-Administered Medications  Medication Dose Route Frequency Provider Last Rate Last Dose  . atorvastatin (LIPITOR) tablet 40 mg  40 mg Oral q1800 Bettey Costa, MD        ECOG PERFORMANCE STATUS:  0 - Asymptomatic  REVIEW OF SYSTEMS: Except for swallowing difficulties history of sick sinus syndrome as well as recent history of fainting Patient denies any weight loss, fatigue,  weakness, fever, chills or night sweats. Patient denies any loss of vision, blurred vision. Patient denies any ringing  of the ears or hearing loss. No irregular heartbeat. Patient denies heart murmur or history of fainting. Patient denies any chest pain or pain radiating to her upper extremities. Patient denies any shortness of breath, difficulty breathing at night, cough or hemoptysis. Patient denies any swelling in the lower legs. Patient denies any nausea vomiting, vomiting of blood, or coffee ground material in the vomitus. Patient denies any stomach pain. Patient states has had normal bowel movements no significant constipation or diarrhea. Patient denies any dysuria, hematuria or significant nocturia. Patient denies any problems walking, swelling in the joints or loss of balance. Patient denies any skin changes, loss of hair or loss of weight. Patient denies any excessive worrying or anxiety or significant depression. Patient denies any problems with insomnia. Patient denies excessive thirst, polyuria, polydipsia. Patient denies any swollen glands, patient denies easy bruising or easy bleeding. Patient denies any recent infections, allergies or URI. Patient "s visual fields have not changed significantly in recent time.    PHYSICAL EXAM: BP (!) 226/84   Pulse 97   Temp 98.2 F (36.8 C)   Ht '5\' 4"'$  (1.626 m)   Wt 141 lb 1.5 oz (64 kg)   BMI 24.22 kg/m  Well-developed well-nourished patient in NAD. HEENT reveals PERLA, EOMI, discs not visualized.  Oral cavity is clear. No oral mucosal lesions are identified. Neck is clear without evidence of cervical or supraclavicular adenopathy. Lungs are clear to A&P. Cardiac examination is essentially unremarkable with regular rate and rhythm without murmur rub or thrill. Abdomen is benign with no organomegaly or masses noted. Motor sensory and DTR levels are equal and symmetric in the upper and lower extremities. Cranial nerves II through XII are grossly intact.  Proprioception is intact. No peripheral adenopathy or edema is identified. No motor or sensory levels are noted. Crude visual fields are within normal range.  LABORATORY DATA: Pathology reports reviewed    RADIOLOGY RESULTS: CT scans reviewed   IMPRESSION: Stage I squamous cell carcinoma the left lower lobe in 82 year old female with multiple comorbidities.  PLAN: At this time I to go ahead with radiation therapy to her left lower lobe. I will plan on delivering 7000 cGy over 7 weeks using I MRT radiation therapy. I would use I MRT based on her previous history of radiation therapy to her right breast as well as to spare normal lung tissue spinal cord and esophagus. Risks and benefits of treatment including development of a cough possible skin reaction fatigue possible slight radiation esophagitis alteration of blood counts all were discussed in detail with the patient and her daughter. They both seem to comprehend my treatment plan well. I've also scheduled the patient be seen by medical oncology for continuity of care although I doubt based on her age and other comorbidities they would recommend a chemotherapy. I'm trying to retrieve her PET scan from  [facility] although it is questionable although that was put been performed as the patient or daughter could not remember that test being done. She has had biopsy of her mediastinal lymph nodes and at this time I am assuming this is stage I disease. I have personally ordered and set up CT simulation for early next week.  I would like to take this opportunity to thank you for allowing me to participate in the care of your patient.Armstead Peaks., MD

## 2016-03-21 ENCOUNTER — Ambulatory Visit
Admission: RE | Admit: 2016-03-21 | Discharge: 2016-03-21 | Disposition: A | Discharge status: Home / Self Care | Payer: Medicare HMO | Source: Ambulatory Visit | Attending: Radiation Oncology | Admitting: Radiation Oncology

## 2016-03-21 ENCOUNTER — Ambulatory Visit: Payer: Medicare HMO

## 2016-03-21 DIAGNOSIS — Z510 Encounter for antineoplastic radiation therapy: Secondary | ICD-10-CM | POA: Diagnosis not present

## 2016-03-24 ENCOUNTER — Other Ambulatory Visit: Payer: Self-pay | Admitting: *Deleted

## 2016-03-24 DIAGNOSIS — Z510 Encounter for antineoplastic radiation therapy: Secondary | ICD-10-CM | POA: Diagnosis not present

## 2016-03-24 DIAGNOSIS — C3432 Malignant neoplasm of lower lobe, left bronchus or lung: Secondary | ICD-10-CM

## 2016-03-29 DIAGNOSIS — Z510 Encounter for antineoplastic radiation therapy: Secondary | ICD-10-CM | POA: Diagnosis not present

## 2016-03-30 ENCOUNTER — Ambulatory Visit
Admission: RE | Admit: 2016-03-30 | Discharge: 2016-03-30 | Disposition: A | Discharge status: Home / Self Care | Payer: Medicare HMO | Source: Ambulatory Visit | Attending: Radiation Oncology | Admitting: Radiation Oncology

## 2016-03-30 DIAGNOSIS — Z510 Encounter for antineoplastic radiation therapy: Secondary | ICD-10-CM | POA: Diagnosis not present

## 2016-04-03 ENCOUNTER — Ambulatory Visit
Admission: RE | Admit: 2016-04-03 | Discharge: 2016-04-03 | Disposition: A | Discharge status: Home / Self Care | Payer: Medicare HMO | Source: Ambulatory Visit | Attending: Radiation Oncology | Admitting: Radiation Oncology

## 2016-04-03 DIAGNOSIS — Z510 Encounter for antineoplastic radiation therapy: Secondary | ICD-10-CM | POA: Diagnosis not present

## 2016-04-04 ENCOUNTER — Ambulatory Visit
Admission: RE | Admit: 2016-04-04 | Discharge: 2016-04-04 | Disposition: A | Discharge status: Home / Self Care | Payer: Medicare HMO | Source: Ambulatory Visit | Attending: Radiation Oncology | Admitting: Radiation Oncology

## 2016-04-04 DIAGNOSIS — Z510 Encounter for antineoplastic radiation therapy: Secondary | ICD-10-CM | POA: Diagnosis not present

## 2016-04-05 ENCOUNTER — Other Ambulatory Visit: Payer: Self-pay | Admitting: *Deleted

## 2016-04-05 ENCOUNTER — Ambulatory Visit
Admission: RE | Admit: 2016-04-05 | Discharge: 2016-04-05 | Disposition: A | Discharge status: Home / Self Care | Payer: Medicare HMO | Source: Ambulatory Visit | Attending: Radiation Oncology | Admitting: Radiation Oncology

## 2016-04-05 DIAGNOSIS — Z510 Encounter for antineoplastic radiation therapy: Secondary | ICD-10-CM | POA: Diagnosis not present

## 2016-04-06 ENCOUNTER — Ambulatory Visit
Admission: RE | Admit: 2016-04-06 | Discharge: 2016-04-06 | Disposition: A | Discharge status: Home / Self Care | Payer: Medicare HMO | Source: Ambulatory Visit | Attending: Radiation Oncology | Admitting: Radiation Oncology

## 2016-04-06 DIAGNOSIS — Z510 Encounter for antineoplastic radiation therapy: Secondary | ICD-10-CM | POA: Diagnosis not present

## 2016-04-07 ENCOUNTER — Ambulatory Visit
Admission: RE | Admit: 2016-04-07 | Discharge: 2016-04-07 | Disposition: A | Discharge status: Home / Self Care | Payer: Medicare HMO | Source: Ambulatory Visit | Attending: Radiation Oncology | Admitting: Radiation Oncology

## 2016-04-07 DIAGNOSIS — Z510 Encounter for antineoplastic radiation therapy: Secondary | ICD-10-CM | POA: Diagnosis not present

## 2016-04-10 ENCOUNTER — Ambulatory Visit
Admission: RE | Admit: 2016-04-10 | Discharge: 2016-04-10 | Disposition: A | Discharge status: Home / Self Care | Payer: Medicare HMO | Source: Ambulatory Visit | Attending: Radiation Oncology | Admitting: Radiation Oncology

## 2016-04-10 ENCOUNTER — Other Ambulatory Visit: Payer: Self-pay | Admitting: *Deleted

## 2016-04-10 DIAGNOSIS — Z510 Encounter for antineoplastic radiation therapy: Secondary | ICD-10-CM | POA: Diagnosis not present

## 2016-04-10 DIAGNOSIS — C3432 Malignant neoplasm of lower lobe, left bronchus or lung: Secondary | ICD-10-CM

## 2016-04-10 MED ORDER — AZITHROMYCIN 250 MG PO TABS: ORAL_TABLET | ORAL | 0 refills | Status: DC

## 2016-04-11 ENCOUNTER — Ambulatory Visit
Admission: RE | Admit: 2016-04-11 | Discharge: 2016-04-11 | Disposition: A | Discharge status: Home / Self Care | Payer: Medicare HMO | Source: Ambulatory Visit | Attending: Radiation Oncology | Admitting: Radiation Oncology

## 2016-04-11 ENCOUNTER — Other Ambulatory Visit: Payer: Self-pay | Admitting: *Deleted

## 2016-04-11 DIAGNOSIS — Z510 Encounter for antineoplastic radiation therapy: Secondary | ICD-10-CM | POA: Diagnosis not present

## 2016-04-12 ENCOUNTER — Inpatient Hospital Stay: Payer: Medicare HMO

## 2016-04-12 ENCOUNTER — Ambulatory Visit
Admission: RE | Admit: 2016-04-12 | Discharge: 2016-04-12 | Disposition: A | Discharge status: Home / Self Care | Payer: Medicare HMO | Source: Ambulatory Visit | Attending: Radiation Oncology | Admitting: Radiation Oncology

## 2016-04-12 DIAGNOSIS — Z853 Personal history of malignant neoplasm of breast: Secondary | ICD-10-CM | POA: Diagnosis not present

## 2016-04-12 DIAGNOSIS — C3432 Malignant neoplasm of lower lobe, left bronchus or lung: Secondary | ICD-10-CM | POA: Diagnosis not present

## 2016-04-12 DIAGNOSIS — Z8673 Personal history of transient ischemic attack (TIA), and cerebral infarction without residual deficits: Secondary | ICD-10-CM | POA: Insufficient documentation

## 2016-04-12 DIAGNOSIS — Z7901 Long term (current) use of anticoagulants: Secondary | ICD-10-CM | POA: Insufficient documentation

## 2016-04-12 DIAGNOSIS — I10 Essential (primary) hypertension: Secondary | ICD-10-CM | POA: Insufficient documentation

## 2016-04-12 DIAGNOSIS — Z923 Personal history of irradiation: Secondary | ICD-10-CM | POA: Insufficient documentation

## 2016-04-12 DIAGNOSIS — R05 Cough: Secondary | ICD-10-CM | POA: Insufficient documentation

## 2016-04-12 DIAGNOSIS — R0602 Shortness of breath: Secondary | ICD-10-CM | POA: Insufficient documentation

## 2016-04-12 DIAGNOSIS — Z87891 Personal history of nicotine dependence: Secondary | ICD-10-CM | POA: Diagnosis not present

## 2016-04-12 DIAGNOSIS — F0390 Unspecified dementia without behavioral disturbance: Secondary | ICD-10-CM | POA: Diagnosis not present

## 2016-04-12 DIAGNOSIS — Z510 Encounter for antineoplastic radiation therapy: Secondary | ICD-10-CM | POA: Diagnosis not present

## 2016-04-12 LAB — CBC
HEMATOCRIT: 32.4 % — AB (ref 35.0–47.0)
Hemoglobin: 10.2 g/dL — ABNORMAL LOW (ref 12.0–16.0)
MCH: 22.6 pg — ABNORMAL LOW (ref 26.0–34.0)
MCHC: 31.3 g/dL — ABNORMAL LOW (ref 32.0–36.0)
MCV: 72.1 fL — ABNORMAL LOW (ref 80.0–100.0)
PLATELETS: 306 10*3/uL (ref 150–440)
RBC: 4.5 MIL/uL (ref 3.80–5.20)
RDW: 16.6 % — AB (ref 11.5–14.5)
WBC: 9.1 10*3/uL (ref 3.6–11.0)

## 2016-04-13 ENCOUNTER — Inpatient Hospital Stay: Payer: Medicare HMO | Attending: Internal Medicine | Admitting: Internal Medicine

## 2016-04-13 ENCOUNTER — Ambulatory Visit
Admission: RE | Admit: 2016-04-13 | Discharge: 2016-04-13 | Disposition: A | Discharge status: Home / Self Care | Payer: Medicare HMO | Source: Ambulatory Visit | Attending: Radiation Oncology | Admitting: Radiation Oncology

## 2016-04-13 DIAGNOSIS — R05 Cough: Secondary | ICD-10-CM

## 2016-04-13 DIAGNOSIS — Z853 Personal history of malignant neoplasm of breast: Secondary | ICD-10-CM

## 2016-04-13 DIAGNOSIS — C3432 Malignant neoplasm of lower lobe, left bronchus or lung: Secondary | ICD-10-CM

## 2016-04-13 DIAGNOSIS — I10 Essential (primary) hypertension: Secondary | ICD-10-CM

## 2016-04-13 DIAGNOSIS — R0602 Shortness of breath: Secondary | ICD-10-CM

## 2016-04-13 DIAGNOSIS — Z8673 Personal history of transient ischemic attack (TIA), and cerebral infarction without residual deficits: Secondary | ICD-10-CM

## 2016-04-13 DIAGNOSIS — Z7901 Long term (current) use of anticoagulants: Secondary | ICD-10-CM

## 2016-04-13 DIAGNOSIS — Z923 Personal history of irradiation: Secondary | ICD-10-CM

## 2016-04-13 DIAGNOSIS — Z87891 Personal history of nicotine dependence: Secondary | ICD-10-CM

## 2016-04-13 DIAGNOSIS — F0390 Unspecified dementia without behavioral disturbance: Secondary | ICD-10-CM

## 2016-04-13 DIAGNOSIS — Z510 Encounter for antineoplastic radiation therapy: Secondary | ICD-10-CM | POA: Diagnosis not present

## 2016-04-13 NOTE — Progress Notes (Signed)
Patient here as new evaluation regarding lung cancer.  Referred by Dr. Donella Stade.

## 2016-04-13 NOTE — Assessment & Plan Note (Signed)
Squamous cell carcinoma stage I; poor surgical candidate. I agree with radiation. I would not suggest any chemotherapy/given the stage and also frail physical condition/multiple comorbidities.  # History of breast cancer remote clinically no evidence of recurrence.   Discussed the patient and daughter that if she has recurrent disease/progressive metastatic disease- consideration for systemic therapy like immunotherapy could be considered. # Recommend continued follow-up with radiation oncology posttreatment; [reluctant follow-up with [place] ] for surveillance scans.  # Above plan of care was discussed with the patient and her daughter in detail. Follow-up as needed; continue to follow up with PCP/radiation oncology.  Thank you Dr.Crystal for allowing me to participate in the care of your pleasant patient. Please do not hesitate to contact me with questions or concerns in the interim.

## 2016-04-13 NOTE — Progress Notes (Signed)
[facility] NOTE  Patient Care Team: Denita Lung, DO as PCP - General (Internal Medicine)  CHIEF COMPLAINTS/PURPOSE OF CONSULTATION:  Oncology History   # left lower lobe mass, which measures 4.3 x 3.1 cm (4:43). 1.4 cm right lower lobe peripheral nodular opacity is unchanged (4:40  # 1998 Breast cancer- s/p Stage I lumpec  & RT. No chemo; no pills     Primary cancer of left lower lobe of lung ([place])   04/13/2016 Initial Diagnosis    Primary cancer of left lower lobe of lung ([place])       #  Oncology History   # left lower lobe mass, which measures 4.3 x 3.1 cm (4:43). 1.4 cm right lower lobe peripheral nodular opacity is unchanged (4:40  # 1998 Breast cancer- s/p Stage I lumpec  & RT. No chemo; no pills     Primary cancer of left lower lobe of lung ([place])   04/13/2016 Initial Diagnosis    Primary cancer of left lower lobe of lung ([facility])       HISTORY OF PRESENTING ILLNESS:  Melissa Palmer 82 y.o.  female recently diagnosed left lower lobe squamous cell carcinoma- incidentally diagnosed while getting a stress test for the heart. Patient had bronchoscopy with biopsy at [facility]; recommend radiation. She decided to follow-up closer to home; was evaluated by Dr. Donella Stade for radiation. Has started radiation last week. Since tolerating well. She has been deferred was for further evaluation/recommendations for medical oncology standpoint.  Patient has mild shortness of breath and mild cough. Has been started on Z-Pak. No hemoptysis. Appetite fair.  ROS: A complete 10 point review of system is done which is negative except mentioned above in history of present illness  MEDICAL HISTORY:  Past Medical History:  Diagnosis Date  . Cancer ([place])   . Cataract   . Dementia   . Hypertension   . TIA (transient ischemic attack)     SURGICAL HISTORY: Past Surgical History:  Procedure Laterality Date  . BREAST SURGERY    . COLONOSCOPY    . HYSTEROTOMY    .  UPPER GI ENDOSCOPY      SOCIAL HISTORY: Social History   Social History  . Marital status: Married    Spouse name: N/A  . Number of children: N/A  . Years of education: N/A   Occupational History  . Not on file.   Social History Main Topics  . Smoking status: Former Research scientist (life sciences)  . Smokeless tobacco: Never Used  . Alcohol use No  . Drug use: No  . Sexual activity: Not on file   Other Topics Concern  . Not on file   Social History Narrative  . No narrative on file    FAMILY HISTORY: Heart disease in family. No family history on file.  ALLERGIES:  is allergic to asa [aspirin]; flagyl [metronidazole]; hydrochlorothiazide; and sulfa antibiotics.  MEDICATIONS:  Current Outpatient Prescriptions  Medication Sig Dispense Refill  . acetaminophen-codeine (TYLENOL #3) 300-30 MG tablet Take 1 tablet by mouth every 4 (four) hours as needed for moderate pain or severe pain.     Marland Kitchen ALPRAZolam (XANAX) 0.5 MG tablet Take 0.5-1 mg by mouth 2 (two) times daily. Take 0.'25mg'$  in morning and take 0.'5mg'$  at night    . apixaban (ELIQUIS) 2.5 MG TABS tablet Take 1 tablet (2.5 mg total) by mouth 2 (two) times daily. 60 tablet 0  . azithromycin (ZITHROMAX Z-PAK) 250 MG tablet 1 pack; follow package directions 6  each 0  . cloNIDine (CATAPRES) 0.1 MG tablet Take 0.1 mg by mouth at bedtime.    Marland Kitchen diltiazem (CARDIZEM CD) 360 MG 24 hr capsule Take 360 mg by mouth daily.    . fluvoxaMINE (LUVOX) 50 MG tablet Take 25 mg by mouth daily at 12 noon.    . hydrOXYzine (ATARAX/VISTARIL) 10 MG tablet Take 10 mg by mouth at bedtime.    . pantoprazole (PROTONIX) 20 MG tablet Take 20 mg by mouth daily.     Current Facility-Administered Medications  Medication Dose Route Frequency Provider Last Rate Last Dose  . atorvastatin (LIPITOR) tablet 40 mg  40 mg Oral q1800 Sital Mody, MD          .  PHYSICAL EXAMINATION: ECOG PERFORMANCE STATUS: 1 - Symptomatic but completely ambulatory  Vitals:   04/13/16 1453  BP:  (!) 179/69  Pulse: 89  Resp: 18  Temp: 98.6 F (37 C)   Filed Weights   04/13/16 1453  Weight: 142 lb 8 oz (64.6 kg)    GENERAL: Well-nourished well-developed; Alert, no distress and comfortable.    EYAccompanied by daughter.ES: no pallor or icterus OROPHARYNX: no thrush or ulceration;   NECK: supple, no masses felt LYMPH:  no palpable lymphadenopathy in the cervical, axillary or inguinal regions LUNGS: decreased breath sounds to auscultation and  No wheeze or crackles HEART/CVS: regular rate & rhythm and no murmurs; No lower extremity edema ABDOMEN: abdomen soft, non-tender and normal bowel sounds Musculoskeletal:no cyanosis of digits and no clubbing  PSYCH: alert & oriented x 3 with fluent speech NEURO: no focal motor/sensory deficits SKIN:  no rashes or significant lesions    RADIOGRAPHIC STUDIES: I have personally reviewed the radiological images as listed and agreed with the findings in the report. No results found.  ASSESSMENT & PLAN:   Primary cancer of left lower lobe of lung ([place]) Squamous cell carcinoma stage I; poor surgical candidate. I agree with radiation. I would not suggest any chemotherapy/given the stage and also frail physical condition/multiple comorbidities.  # History of breast cancer remote clinically no evidence of recurrence.   Discussed the patient and daughter that if she has recurrent disease/progressive metastatic disease- consideration for systemic therapy like immunotherapy could be considered. # Recommend continued follow-up with radiation oncology posttreatment; [reluctant follow-up with [place] ] for surveillance scans.  # Above plan of care was discussed with the patient and her daughter in detail. Follow-up as needed; continue to follow up with PCP/radiation oncology.  Thank you Dr.Crystal for allowing me to participate in the care of your pleasant patient. Please do not hesitate to contact me with questions or concerns in the interim.       Cammie Sickle, MD 04/13/2016 4:07 PM

## 2016-04-14 ENCOUNTER — Ambulatory Visit
Admission: RE | Admit: 2016-04-14 | Discharge: 2016-04-14 | Disposition: A | Discharge status: Home / Self Care | Payer: Medicare HMO | Source: Ambulatory Visit | Attending: Radiation Oncology | Admitting: Radiation Oncology

## 2016-04-14 DIAGNOSIS — Z510 Encounter for antineoplastic radiation therapy: Secondary | ICD-10-CM | POA: Diagnosis not present

## 2016-04-18 ENCOUNTER — Ambulatory Visit
Admission: RE | Admit: 2016-04-18 | Discharge: 2016-04-18 | Disposition: A | Discharge status: Home / Self Care | Payer: Medicare HMO | Source: Ambulatory Visit | Attending: Radiation Oncology | Admitting: Radiation Oncology

## 2016-04-18 DIAGNOSIS — Z510 Encounter for antineoplastic radiation therapy: Secondary | ICD-10-CM | POA: Diagnosis not present

## 2016-04-19 ENCOUNTER — Ambulatory Visit
Admission: RE | Admit: 2016-04-19 | Discharge: 2016-04-19 | Disposition: A | Discharge status: Home / Self Care | Payer: Medicare HMO | Source: Ambulatory Visit | Attending: Radiation Oncology | Admitting: Radiation Oncology

## 2016-04-19 ENCOUNTER — Inpatient Hospital Stay: Payer: Medicare HMO | Attending: Internal Medicine

## 2016-04-19 ENCOUNTER — Other Ambulatory Visit: Payer: Self-pay

## 2016-04-19 DIAGNOSIS — C3432 Malignant neoplasm of lower lobe, left bronchus or lung: Secondary | ICD-10-CM | POA: Insufficient documentation

## 2016-04-19 DIAGNOSIS — Z510 Encounter for antineoplastic radiation therapy: Secondary | ICD-10-CM | POA: Diagnosis not present

## 2016-04-19 LAB — CBC
HEMATOCRIT: 32.7 % — AB (ref 35.0–47.0)
Hemoglobin: 10.4 g/dL — ABNORMAL LOW (ref 12.0–16.0)
MCH: 22.6 pg — AB (ref 26.0–34.0)
MCHC: 31.9 g/dL — AB (ref 32.0–36.0)
MCV: 70.8 fL — AB (ref 80.0–100.0)
Platelets: 475 10*3/uL — ABNORMAL HIGH (ref 150–440)
RBC: 4.61 MIL/uL (ref 3.80–5.20)
RDW: 16.8 % — AB (ref 11.5–14.5)
WBC: 7.8 10*3/uL (ref 3.6–11.0)

## 2016-04-20 ENCOUNTER — Ambulatory Visit: Payer: Medicare HMO

## 2016-04-20 ENCOUNTER — Ambulatory Visit
Admission: RE | Admit: 2016-04-20 | Discharge: 2016-04-20 | Disposition: A | Discharge status: Home / Self Care | Payer: Medicare HMO | Source: Ambulatory Visit | Attending: Radiation Oncology | Admitting: Radiation Oncology

## 2016-04-21 ENCOUNTER — Ambulatory Visit
Admission: RE | Admit: 2016-04-21 | Discharge: 2016-04-21 | Disposition: A | Discharge status: Home / Self Care | Payer: Medicare HMO | Source: Ambulatory Visit | Attending: Radiation Oncology | Admitting: Radiation Oncology

## 2016-04-21 DIAGNOSIS — Z510 Encounter for antineoplastic radiation therapy: Secondary | ICD-10-CM | POA: Diagnosis not present

## 2016-04-24 ENCOUNTER — Other Ambulatory Visit: Payer: Self-pay | Admitting: *Deleted

## 2016-04-24 ENCOUNTER — Ambulatory Visit
Admission: RE | Admit: 2016-04-24 | Discharge: 2016-04-24 | Disposition: A | Discharge status: Home / Self Care | Payer: Medicare HMO | Source: Ambulatory Visit | Attending: Internal Medicine | Admitting: Internal Medicine

## 2016-04-24 ENCOUNTER — Ambulatory Visit
Admission: RE | Admit: 2016-04-24 | Discharge: 2016-04-24 | Disposition: A | Discharge status: Home / Self Care | Payer: Medicare HMO | Source: Ambulatory Visit | Attending: Radiation Oncology | Admitting: Radiation Oncology

## 2016-04-24 DIAGNOSIS — R509 Fever, unspecified: Secondary | ICD-10-CM | POA: Insufficient documentation

## 2016-04-24 DIAGNOSIS — Z510 Encounter for antineoplastic radiation therapy: Secondary | ICD-10-CM | POA: Diagnosis not present

## 2016-04-24 DIAGNOSIS — C3432 Malignant neoplasm of lower lobe, left bronchus or lung: Secondary | ICD-10-CM | POA: Diagnosis not present

## 2016-04-24 DIAGNOSIS — R0602 Shortness of breath: Secondary | ICD-10-CM

## 2016-04-24 DIAGNOSIS — I700 Atherosclerosis of aorta: Secondary | ICD-10-CM | POA: Diagnosis not present

## 2016-04-24 DIAGNOSIS — J849 Interstitial pulmonary disease, unspecified: Secondary | ICD-10-CM | POA: Diagnosis not present

## 2016-04-24 NOTE — Progress Notes (Signed)
Notified by [facility] in radiation therapy.   Patient c/o persistent shortness of breath and fevers at night of 100.  Pt has been on a series of abx including zpac. No change in symptoms.  RN spoke with Dr. Rogue Bussing - md ordered a stat cxr today. And add on lab encounter tommorrow.  Pt will be contacted with the results.

## 2016-04-25 ENCOUNTER — Inpatient Hospital Stay: Payer: Medicare HMO

## 2016-04-25 ENCOUNTER — Other Ambulatory Visit: Payer: Self-pay

## 2016-04-25 ENCOUNTER — Ambulatory Visit
Admission: RE | Admit: 2016-04-25 | Discharge: 2016-04-25 | Disposition: A | Discharge status: Home / Self Care | Payer: Medicare HMO | Source: Ambulatory Visit | Attending: Radiation Oncology | Admitting: Radiation Oncology

## 2016-04-25 DIAGNOSIS — Z510 Encounter for antineoplastic radiation therapy: Secondary | ICD-10-CM | POA: Diagnosis not present

## 2016-04-25 DIAGNOSIS — R509 Fever, unspecified: Secondary | ICD-10-CM

## 2016-04-25 DIAGNOSIS — C3432 Malignant neoplasm of lower lobe, left bronchus or lung: Secondary | ICD-10-CM | POA: Diagnosis not present

## 2016-04-25 DIAGNOSIS — R0602 Shortness of breath: Secondary | ICD-10-CM

## 2016-04-25 LAB — COMPREHENSIVE METABOLIC PANEL
ALT: 9 U/L — AB (ref 14–54)
AST: 18 U/L (ref 15–41)
Albumin: 3.9 g/dL (ref 3.5–5.0)
Alkaline Phosphatase: 66 U/L (ref 38–126)
Anion gap: 7 (ref 5–15)
BUN: 19 mg/dL (ref 6–20)
CHLORIDE: 104 mmol/L (ref 101–111)
CO2: 24 mmol/L (ref 22–32)
CREATININE: 1.3 mg/dL — AB (ref 0.44–1.00)
Calcium: 9.1 mg/dL (ref 8.9–10.3)
GFR calc non Af Amer: 36 mL/min — ABNORMAL LOW (ref >60)
GFR, EST AFRICAN AMERICAN: 42 mL/min — AB (ref >60)
Glucose, Bld: 101 mg/dL — ABNORMAL HIGH (ref 65–99)
POTASSIUM: 4 mmol/L (ref 3.5–5.1)
SODIUM: 135 mmol/L (ref 135–145)
Total Bilirubin: 0.3 mg/dL (ref 0.3–1.2)
Total Protein: 7.6 g/dL (ref 6.5–8.1)

## 2016-04-25 LAB — CBC WITH DIFFERENTIAL/PLATELET
BASOS ABS: 0.1 10*3/uL (ref 0–0.1)
Basophils Relative: 1 %
EOS ABS: 0.1 10*3/uL (ref 0–0.7)
EOS PCT: 2 %
HCT: 30.7 % — ABNORMAL LOW (ref 35.0–47.0)
Hemoglobin: 9.8 g/dL — ABNORMAL LOW (ref 12.0–16.0)
LYMPHS ABS: 0.9 10*3/uL — AB (ref 1.0–3.6)
Lymphocytes Relative: 14 %
MCH: 22.5 pg — AB (ref 26.0–34.0)
MCHC: 31.8 g/dL — ABNORMAL LOW (ref 32.0–36.0)
MCV: 70.7 fL — ABNORMAL LOW (ref 80.0–100.0)
Monocytes Absolute: 0.7 10*3/uL (ref 0.2–0.9)
Monocytes Relative: 11 %
Neutro Abs: 4.7 10*3/uL (ref 1.4–6.5)
Neutrophils Relative %: 72 %
PLATELETS: 443 10*3/uL — AB (ref 150–440)
RBC: 4.35 MIL/uL (ref 3.80–5.20)
RDW: 16.6 % — AB (ref 11.5–14.5)
WBC: 6.6 10*3/uL (ref 3.6–11.0)

## 2016-04-26 ENCOUNTER — Inpatient Hospital Stay: Payer: Medicare HMO

## 2016-04-26 ENCOUNTER — Ambulatory Visit
Admission: RE | Admit: 2016-04-26 | Discharge: 2016-04-26 | Disposition: A | Discharge status: Home / Self Care | Payer: Medicare HMO | Source: Ambulatory Visit | Attending: Radiation Oncology | Admitting: Radiation Oncology

## 2016-04-26 DIAGNOSIS — Z510 Encounter for antineoplastic radiation therapy: Secondary | ICD-10-CM | POA: Diagnosis not present

## 2016-04-27 ENCOUNTER — Ambulatory Visit
Admission: RE | Admit: 2016-04-27 | Discharge: 2016-04-27 | Disposition: A | Discharge status: Home / Self Care | Payer: Medicare HMO | Source: Ambulatory Visit | Attending: Radiation Oncology | Admitting: Radiation Oncology

## 2016-04-27 DIAGNOSIS — Z510 Encounter for antineoplastic radiation therapy: Secondary | ICD-10-CM | POA: Diagnosis not present

## 2016-04-28 ENCOUNTER — Ambulatory Visit
Admission: RE | Admit: 2016-04-28 | Discharge: 2016-04-28 | Disposition: A | Discharge status: Home / Self Care | Payer: Medicare HMO | Source: Ambulatory Visit | Attending: Radiation Oncology | Admitting: Radiation Oncology

## 2016-04-28 DIAGNOSIS — Z510 Encounter for antineoplastic radiation therapy: Secondary | ICD-10-CM | POA: Diagnosis not present

## 2016-05-01 ENCOUNTER — Telehealth: Payer: Self-pay | Admitting: *Deleted

## 2016-05-01 ENCOUNTER — Ambulatory Visit
Admission: RE | Admit: 2016-05-01 | Discharge: 2016-05-01 | Disposition: A | Discharge status: Home / Self Care | Payer: Medicare HMO | Source: Ambulatory Visit | Attending: Radiation Oncology | Admitting: Radiation Oncology

## 2016-05-01 DIAGNOSIS — Z510 Encounter for antineoplastic radiation therapy: Secondary | ICD-10-CM | POA: Diagnosis not present

## 2016-05-01 NOTE — Telephone Encounter (Signed)
-----   Message from Cammie Sickle, MD sent at 04/28/2016  5:14 PM EDT ----- Confirm IF fever has resolved; if NOT inform me. PLease inform patient labs show a mild anemia/ mild kidney disease/chronic. Recommend increased fluids. Otherwise no signs of obvious infection noted at this time.

## 2016-05-01 NOTE — Telephone Encounter (Signed)
Spoke with daughter-[place]. patient does not have a fever. Labs values discussed with daughter. Explained to daughter that pt's Labs demonstrated mild anemia and decreased kidney function. Educated daughter and asked to see that patient drinks more fluids. 2-3 Liters per day of fluids. Teach back process performed.

## 2016-05-02 ENCOUNTER — Ambulatory Visit
Admission: RE | Admit: 2016-05-02 | Discharge: 2016-05-02 | Disposition: A | Discharge status: Home / Self Care | Payer: Medicare HMO | Source: Ambulatory Visit | Attending: Radiation Oncology | Admitting: Radiation Oncology

## 2016-05-02 DIAGNOSIS — Z510 Encounter for antineoplastic radiation therapy: Secondary | ICD-10-CM | POA: Diagnosis not present

## 2016-05-03 ENCOUNTER — Inpatient Hospital Stay: Payer: Medicare HMO

## 2016-05-03 ENCOUNTER — Ambulatory Visit
Admission: RE | Admit: 2016-05-03 | Discharge: 2016-05-03 | Disposition: A | Discharge status: Home / Self Care | Payer: Medicare HMO | Source: Ambulatory Visit | Attending: Radiation Oncology | Admitting: Radiation Oncology

## 2016-05-03 DIAGNOSIS — C3432 Malignant neoplasm of lower lobe, left bronchus or lung: Secondary | ICD-10-CM | POA: Diagnosis not present

## 2016-05-03 LAB — CBC
HEMATOCRIT: 31.6 % — AB (ref 35.0–47.0)
HEMOGLOBIN: 10 g/dL — AB (ref 12.0–16.0)
MCH: 22.5 pg — ABNORMAL LOW (ref 26.0–34.0)
MCHC: 31.8 g/dL — ABNORMAL LOW (ref 32.0–36.0)
MCV: 70.9 fL — ABNORMAL LOW (ref 80.0–100.0)
Platelets: 372 10*3/uL (ref 150–440)
RBC: 4.46 MIL/uL (ref 3.80–5.20)
RDW: 17.2 % — ABNORMAL HIGH (ref 11.5–14.5)
WBC: 5.8 10*3/uL (ref 3.6–11.0)

## 2016-05-04 ENCOUNTER — Ambulatory Visit
Admission: RE | Admit: 2016-05-04 | Discharge: 2016-05-04 | Disposition: A | Discharge status: Home / Self Care | Payer: Medicare HMO | Source: Ambulatory Visit | Attending: Radiation Oncology | Admitting: Radiation Oncology

## 2016-05-04 DIAGNOSIS — Z510 Encounter for antineoplastic radiation therapy: Secondary | ICD-10-CM | POA: Diagnosis not present

## 2016-05-05 ENCOUNTER — Ambulatory Visit
Admission: RE | Admit: 2016-05-05 | Discharge: 2016-05-05 | Disposition: A | Discharge status: Home / Self Care | Payer: Medicare HMO | Source: Ambulatory Visit | Attending: Radiation Oncology | Admitting: Radiation Oncology

## 2016-05-05 DIAGNOSIS — Z510 Encounter for antineoplastic radiation therapy: Secondary | ICD-10-CM | POA: Diagnosis not present

## 2016-05-08 ENCOUNTER — Ambulatory Visit
Admission: RE | Admit: 2016-05-08 | Discharge: 2016-05-08 | Disposition: A | Discharge status: Home / Self Care | Payer: Medicare HMO | Source: Ambulatory Visit | Attending: Radiation Oncology | Admitting: Radiation Oncology

## 2016-05-08 DIAGNOSIS — Z510 Encounter for antineoplastic radiation therapy: Secondary | ICD-10-CM | POA: Diagnosis not present

## 2016-05-09 ENCOUNTER — Ambulatory Visit
Admission: RE | Admit: 2016-05-09 | Discharge: 2016-05-09 | Disposition: A | Discharge status: Home / Self Care | Payer: Medicare HMO | Source: Ambulatory Visit | Attending: Radiation Oncology | Admitting: Radiation Oncology

## 2016-05-09 DIAGNOSIS — Z510 Encounter for antineoplastic radiation therapy: Secondary | ICD-10-CM | POA: Diagnosis not present

## 2016-05-10 ENCOUNTER — Inpatient Hospital Stay: Payer: Medicare HMO

## 2016-05-10 ENCOUNTER — Ambulatory Visit
Admission: RE | Admit: 2016-05-10 | Discharge: 2016-05-10 | Disposition: A | Discharge status: Home / Self Care | Payer: Medicare HMO | Source: Ambulatory Visit | Attending: Radiation Oncology | Admitting: Radiation Oncology

## 2016-05-10 ENCOUNTER — Ambulatory Visit: Admission: RE | Admit: 2016-05-10 | Payer: Medicare HMO | Source: Ambulatory Visit

## 2016-05-10 DIAGNOSIS — Z510 Encounter for antineoplastic radiation therapy: Secondary | ICD-10-CM | POA: Diagnosis not present

## 2016-05-10 DIAGNOSIS — C3432 Malignant neoplasm of lower lobe, left bronchus or lung: Secondary | ICD-10-CM | POA: Diagnosis not present

## 2016-05-10 LAB — CBC
HEMATOCRIT: 32.7 % — AB (ref 35.0–47.0)
HEMOGLOBIN: 10.4 g/dL — AB (ref 12.0–16.0)
MCH: 22.2 pg — ABNORMAL LOW (ref 26.0–34.0)
MCHC: 31.9 g/dL — ABNORMAL LOW (ref 32.0–36.0)
MCV: 69.8 fL — AB (ref 80.0–100.0)
PLATELETS: 348 10*3/uL (ref 150–440)
RBC: 4.68 MIL/uL (ref 3.80–5.20)
RDW: 16.6 % — ABNORMAL HIGH (ref 11.5–14.5)
WBC: 6.4 10*3/uL (ref 3.6–11.0)

## 2016-05-11 ENCOUNTER — Ambulatory Visit
Admission: RE | Admit: 2016-05-11 | Discharge: 2016-05-11 | Disposition: A | Discharge status: Home / Self Care | Payer: Medicare HMO | Source: Ambulatory Visit | Attending: Radiation Oncology | Admitting: Radiation Oncology

## 2016-05-11 DIAGNOSIS — Z510 Encounter for antineoplastic radiation therapy: Secondary | ICD-10-CM | POA: Diagnosis not present

## 2016-05-12 ENCOUNTER — Ambulatory Visit
Admission: RE | Admit: 2016-05-12 | Discharge: 2016-05-12 | Disposition: A | Discharge status: Home / Self Care | Payer: Medicare HMO | Source: Ambulatory Visit | Attending: Radiation Oncology | Admitting: Radiation Oncology

## 2016-05-12 DIAGNOSIS — Z510 Encounter for antineoplastic radiation therapy: Secondary | ICD-10-CM | POA: Diagnosis not present

## 2016-05-15 ENCOUNTER — Ambulatory Visit
Admission: RE | Admit: 2016-05-15 | Discharge: 2016-05-15 | Disposition: A | Discharge status: Home / Self Care | Payer: Medicare HMO | Source: Ambulatory Visit | Attending: Radiation Oncology | Admitting: Radiation Oncology

## 2016-05-15 DIAGNOSIS — Z510 Encounter for antineoplastic radiation therapy: Secondary | ICD-10-CM | POA: Diagnosis not present

## 2016-05-16 ENCOUNTER — Ambulatory Visit
Admission: RE | Admit: 2016-05-16 | Discharge: 2016-05-16 | Disposition: A | Discharge status: Home / Self Care | Payer: Medicare HMO | Source: Ambulatory Visit | Attending: Radiation Oncology | Admitting: Radiation Oncology

## 2016-05-16 DIAGNOSIS — Z510 Encounter for antineoplastic radiation therapy: Secondary | ICD-10-CM | POA: Diagnosis not present

## 2016-05-17 ENCOUNTER — Ambulatory Visit
Admission: RE | Admit: 2016-05-17 | Discharge: 2016-05-17 | Disposition: A | Discharge status: Home / Self Care | Payer: Medicare HMO | Source: Ambulatory Visit | Attending: Radiation Oncology | Admitting: Radiation Oncology

## 2016-05-17 ENCOUNTER — Inpatient Hospital Stay: Payer: Medicare HMO | Attending: Internal Medicine

## 2016-05-17 ENCOUNTER — Other Ambulatory Visit: Payer: Self-pay

## 2016-05-17 DIAGNOSIS — C3432 Malignant neoplasm of lower lobe, left bronchus or lung: Secondary | ICD-10-CM | POA: Diagnosis present

## 2016-05-17 DIAGNOSIS — Z510 Encounter for antineoplastic radiation therapy: Secondary | ICD-10-CM | POA: Diagnosis not present

## 2016-05-17 LAB — CBC
HCT: 32.1 % — ABNORMAL LOW (ref 35.0–47.0)
Hemoglobin: 10.2 g/dL — ABNORMAL LOW (ref 12.0–16.0)
MCH: 22.3 pg — ABNORMAL LOW (ref 26.0–34.0)
MCHC: 31.8 g/dL — AB (ref 32.0–36.0)
MCV: 70.1 fL — ABNORMAL LOW (ref 80.0–100.0)
PLATELETS: 331 10*3/uL (ref 150–440)
RBC: 4.58 MIL/uL (ref 3.80–5.20)
RDW: 16.7 % — AB (ref 11.5–14.5)
WBC: 7.4 10*3/uL (ref 3.6–11.0)

## 2016-05-18 ENCOUNTER — Ambulatory Visit
Admission: RE | Admit: 2016-05-18 | Discharge: 2016-05-18 | Disposition: A | Discharge status: Home / Self Care | Payer: Medicare HMO | Source: Ambulatory Visit | Attending: Radiation Oncology | Admitting: Radiation Oncology

## 2016-05-18 DIAGNOSIS — Z510 Encounter for antineoplastic radiation therapy: Secondary | ICD-10-CM | POA: Diagnosis not present

## 2016-05-19 ENCOUNTER — Ambulatory Visit
Admission: RE | Admit: 2016-05-19 | Discharge: 2016-05-19 | Disposition: A | Discharge status: Home / Self Care | Payer: Medicare HMO | Source: Ambulatory Visit | Attending: Radiation Oncology | Admitting: Radiation Oncology

## 2016-05-19 DIAGNOSIS — Z510 Encounter for antineoplastic radiation therapy: Secondary | ICD-10-CM | POA: Diagnosis not present

## 2016-05-22 ENCOUNTER — Ambulatory Visit: Payer: Medicare HMO

## 2016-05-22 DIAGNOSIS — Z510 Encounter for antineoplastic radiation therapy: Secondary | ICD-10-CM | POA: Diagnosis not present

## 2016-05-23 ENCOUNTER — Ambulatory Visit: Payer: Medicare HMO

## 2016-05-23 DIAGNOSIS — Z510 Encounter for antineoplastic radiation therapy: Secondary | ICD-10-CM | POA: Diagnosis not present

## 2016-05-24 ENCOUNTER — Ambulatory Visit: Payer: Medicare HMO

## 2016-06-26 ENCOUNTER — Encounter: Payer: Self-pay | Admitting: Radiation Oncology

## 2016-06-26 ENCOUNTER — Other Ambulatory Visit: Payer: Self-pay | Admitting: *Deleted

## 2016-06-26 ENCOUNTER — Ambulatory Visit
Admission: RE | Admit: 2016-06-26 | Discharge: 2016-06-26 | Disposition: A | Discharge status: Home / Self Care | Payer: Medicare HMO | Source: Ambulatory Visit | Attending: Radiation Oncology | Admitting: Radiation Oncology

## 2016-06-26 VITALS — BP 181/73 | HR 90 | Temp 97.0°F | Resp 22 | Ht 64.0 in | Wt 141.4 lb

## 2016-06-26 DIAGNOSIS — C3432 Malignant neoplasm of lower lobe, left bronchus or lung: Secondary | ICD-10-CM | POA: Diagnosis present

## 2016-06-26 DIAGNOSIS — C3490 Malignant neoplasm of unspecified part of unspecified bronchus or lung: Secondary | ICD-10-CM

## 2016-06-26 DIAGNOSIS — Z923 Personal history of irradiation: Secondary | ICD-10-CM | POA: Insufficient documentation

## 2016-06-26 DIAGNOSIS — I10 Essential (primary) hypertension: Secondary | ICD-10-CM | POA: Insufficient documentation

## 2016-06-26 DIAGNOSIS — I495 Sick sinus syndrome: Secondary | ICD-10-CM | POA: Diagnosis not present

## 2016-06-26 DIAGNOSIS — R42 Dizziness and giddiness: Secondary | ICD-10-CM | POA: Insufficient documentation

## 2016-06-26 DIAGNOSIS — Z8673 Personal history of transient ischemic attack (TIA), and cerebral infarction without residual deficits: Secondary | ICD-10-CM | POA: Diagnosis not present

## 2016-06-26 DIAGNOSIS — R55 Syncope and collapse: Secondary | ICD-10-CM | POA: Diagnosis not present

## 2016-06-26 NOTE — Progress Notes (Signed)
Radiation Oncology Follow up Note  Name: Melissa Palmer   Date:   06/26/2016 MRN:  [id number] DOB: 24-Jan-1928    This 82 y.o. female presents to the clinic today for one-month follow-up status post I MRT radiation therapy for stage TII N0 squamous cell carcinoma the left lower lobe.  REFERRING PROVIDER: Denita Lung, DO  HPI: Patient is an 82 year old female with multiple comorbidities including history of CVA, sick sinus syndrome pacemaker placement. She had a 4 cm lobulated left lower lobe mass picked up at the time of evaluation. And received 7000 cGy to this region without concurrent chemotherapy. She is seen today in routine follow-up one month out. Her major complaint of some dizziness and fainting which may be related to some changes seen on her previous CT scan showing atrophy changes of the brain and previous history of CVA. She specifically denies dysphagia cough hemoptysis or chest tightness. She is accompanied by 2 of her daughters today.  COMPLICATIONS OF TREATMENT: none  FOLLOW UP COMPLIANCE: keeps appointments   PHYSICAL EXAM:  BP (!) 181/73   Pulse 90   Temp 97 F (36.1 C)   Resp (!) 22   Ht '5\' 4"'$  (1.626 m)   Wt 141 lb 6.8 oz (64.1 kg)   BMI 24.28 kg/m  Elderly female in NAD. Well-developed well-nourished patient in NAD. HEENT reveals PERLA, EOMI, discs not visualized.  Oral cavity is clear. No oral mucosal lesions are identified. Neck is clear without evidence of cervical or supraclavicular adenopathy. Lungs are clear to A&P. Cardiac examination is essentially unremarkable with regular rate and rhythm without murmur rub or thrill. Abdomen is benign with no organomegaly or masses noted. Motor sensory and DTR levels are equal and symmetric in the upper and lower extremities. Cranial nerves II through XII are grossly intact. Proprioception is intact. No peripheral adenopathy or edema is identified. No motor or sensory levels are noted. Crude visual fields are within normal  range.  RADIOLOGY RESULTS: CT scan ordered in 3 months  PLAN: At the present time she is doing well. Based on history of fainting or having lightheadedness and dizziness I've asked her to use her walker. She is on hypertension medicine and they will see Dr. Caryl Comes again to discuss some of those problems. Certainly the dizziness could be related to her age atrophic changes of the brain as well as her history of sick sinus syndrome and other comorbidities. I've asked to see her back in 3 months for follow-up and of ordered a CT scan prior to that visit. Patient knows to call sooner with any concerns.  I would like to take this opportunity to thank you for allowing me to participate in the care of your patient.Armstead Peaks., MD

## 2016-08-28 IMAGING — CT CT HEAD W/O CM
3 series · 16 of 47 positions shown, 19 images · non-contrast
Comparison: 03/07/2016

CLINICAL DATA: CVA, pt states she's been out of breath and
confused.

EXAM:
CT HEAD WITHOUT CONTRAST
TECHNIQUE: Contiguous axial images were obtained from the base of the skull
through the vertex without intravenous contrast.

[Series 2: head wo · axial · 0.40mm/px · z∈[+327,+452]mm · 10 of 31 slices shown, 13 images]
[im 3/31  brain]
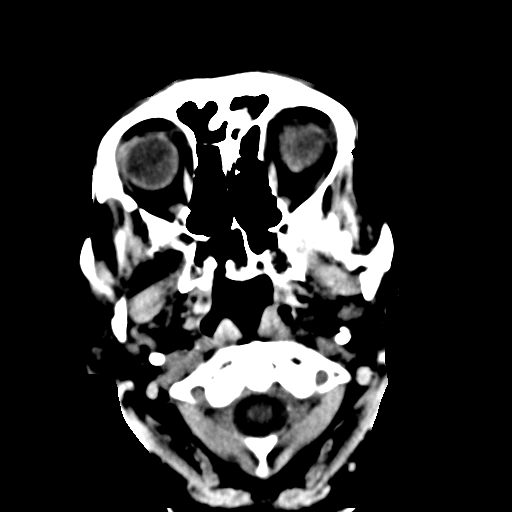
[im 3/31  bone]
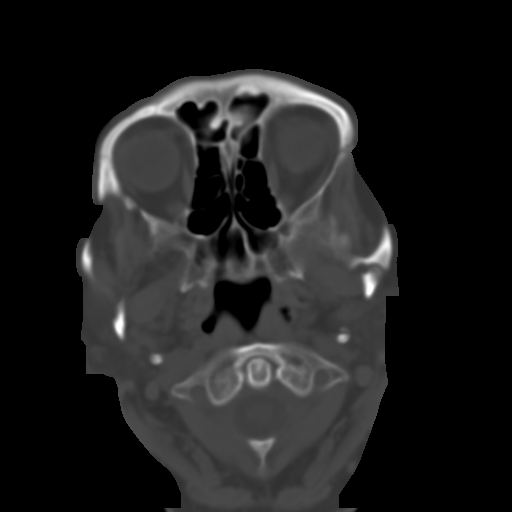
[im 6/31  brain]
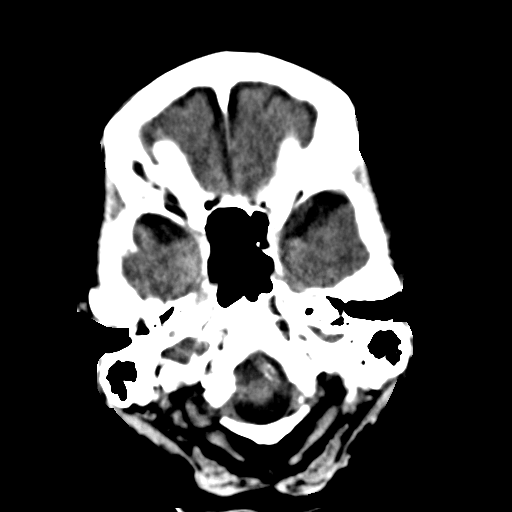
[im 9/31  brain]
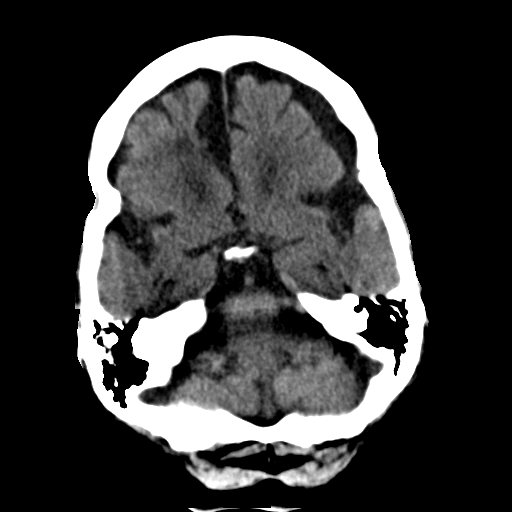
[im 11/31  brain]
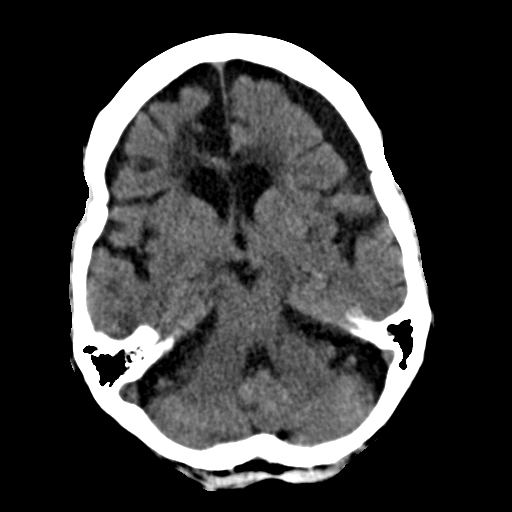
[im 14/31  brain]
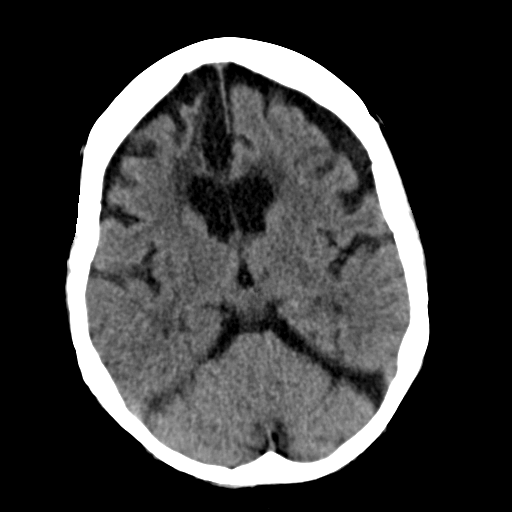
[im 14/31  bone]
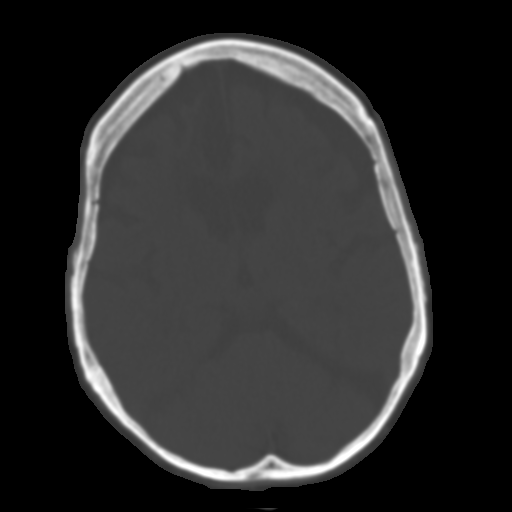
[im 17/31  brain]
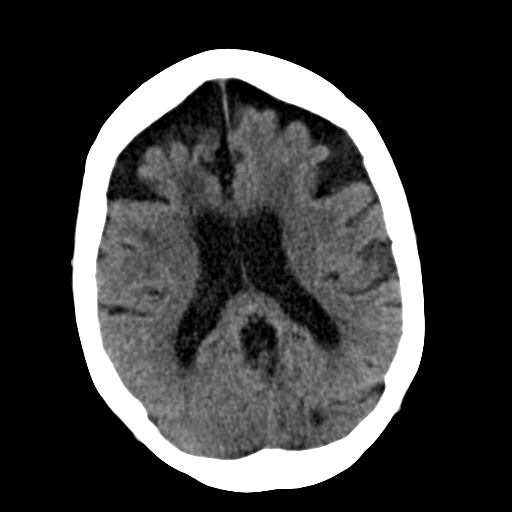
[im 20/31  brain]
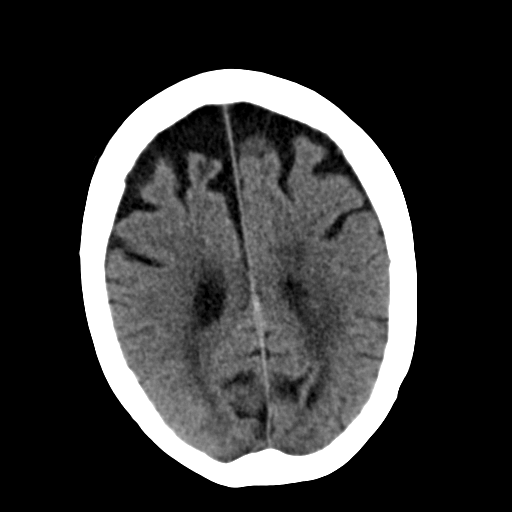
[im 23/31  brain]
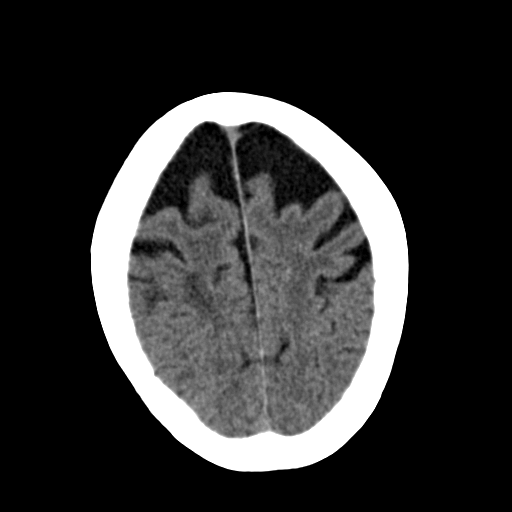
[im 25/31  brain]
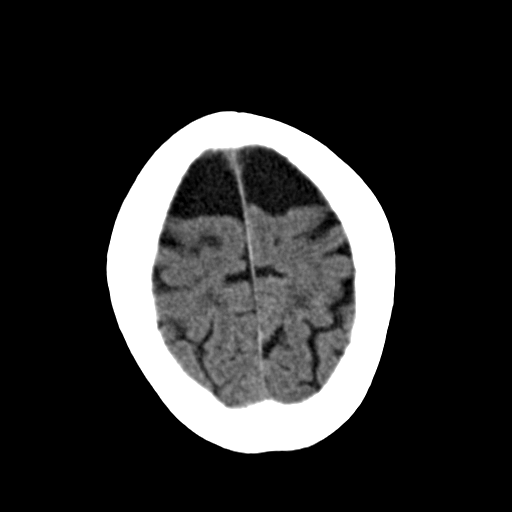
[im 25/31  bone]
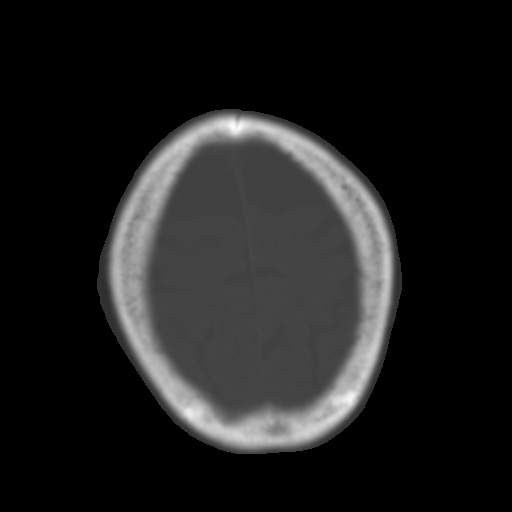
[im 28/31  brain]
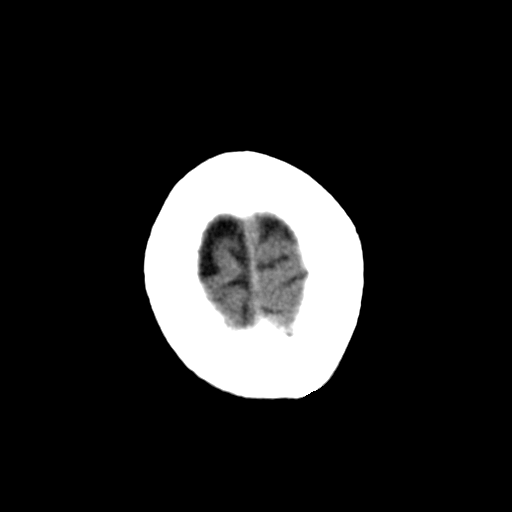

[Series 4: coronal soft tissue · coronal · 0.29mm/px · 3 of 64 slices shown]
[im 22/64  brain]
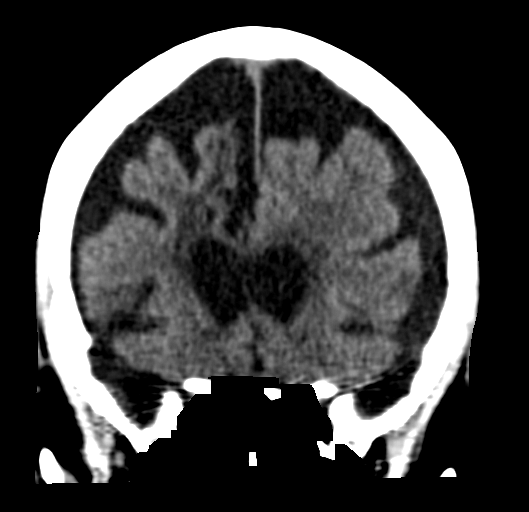
[im 29/64  brain]
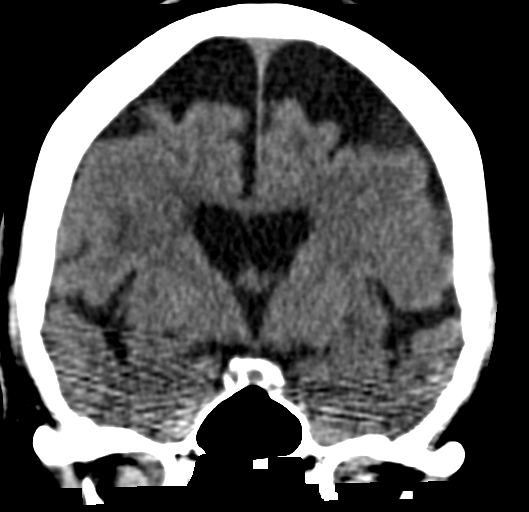
[im 36/64  brain]
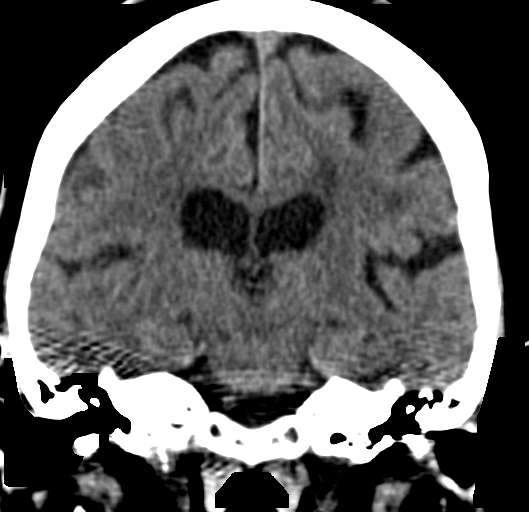

[Series 5: sagittal soft tissue · sagittal · 0.31mm/px · 3 of 50 slices shown]
[im 17/50  brain]
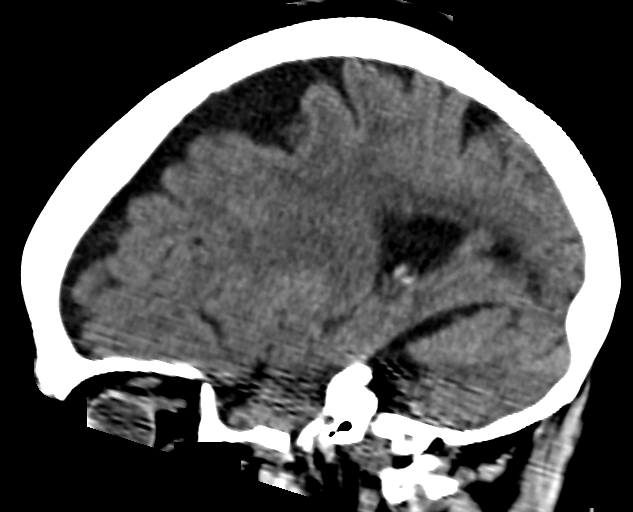
[im 25/50  brain]
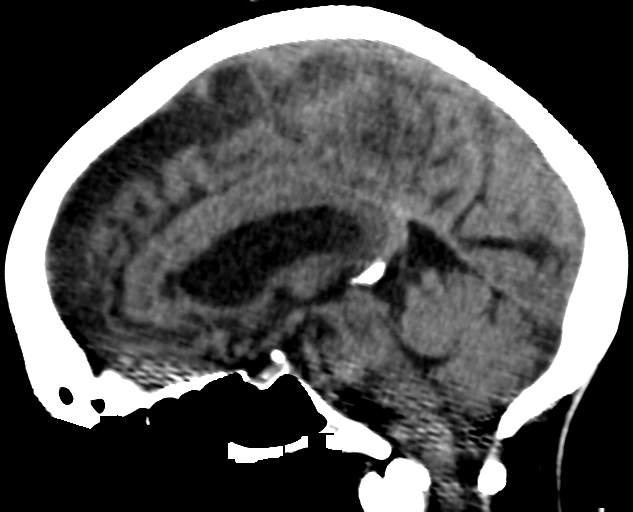
[im 33/50  brain]
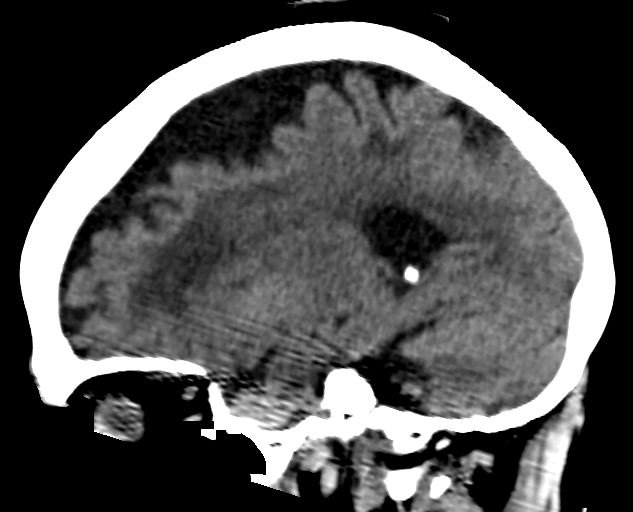

[16 of 47 positions shown; findings below may reference images not displayed]

FINDINGS: The ventricles are normal in configuration. There is ventricular and
sulcal enlargement, with widening of the extra-axial spaces mostly
over the frontal lobes, consistent with diffuse atrophy, stable from
the previous day's study. There is an old infarct involving the
anterior right frontal lobe. There is no evidence of a recent
cortical infarct. There are no parenchymal masses or mass effect.
Patchy white matter hypoattenuation is noted consistent with chronic
microvascular ischemic change.

There are no extra-axial masses.

No intracranial hemorrhage.

The visualized sinuses and mastoid air cells are clear.
IMPRESSION: 1. No acute intracranial abnormalities.
2. Atrophy without change the previous day's study. Old right ACA
distribution frontal lobe infarct. Chronic microvascular ischemic
change.

## 2016-09-04 ENCOUNTER — Telehealth: Payer: Self-pay | Admitting: *Deleted

## 2016-09-04 NOTE — Telephone Encounter (Signed)
Advised daughter to take patient to PCP or emergency room to be seen for shortness of breath

## 2016-09-04 NOTE — Telephone Encounter (Signed)
Patients daughter called for an appointment to see Dr. Donella Stade because her mother was very short of breath and unable to breath. I advised daughter that Dr. Donella Stade was not in the office today and she should contact her PCP or take her mother to the emergency room for immediate treatment. She verbalized understanding.

## 2016-09-12 ENCOUNTER — Other Ambulatory Visit: Payer: Self-pay | Admitting: Internal Medicine

## 2016-09-12 DIAGNOSIS — N6320 Unspecified lump in the left breast, unspecified quadrant: Principal | ICD-10-CM

## 2016-09-12 DIAGNOSIS — N6325 Unspecified lump in the left breast, overlapping quadrants: Secondary | ICD-10-CM

## 2016-09-18 ENCOUNTER — Telehealth: Payer: Self-pay | Admitting: *Deleted

## 2016-09-18 NOTE — Telephone Encounter (Signed)
Agrees to 830 appt tomorrow in [facility]

## 2016-09-18 NOTE — Telephone Encounter (Signed)
Having a grabbing pain at radiation site left breast rib cage area which "burns all the way through to her back". Tender to touch and is unable to do her mammogram because of this. Fever 100.5 off and on times 1 month. She went to her PCP who put her on Cipro and did a CXR which was nml. She continues to have pain "all inside" Also reports as a cough that comes and goes which nonproductive. Asking to be seen and evaluated asap

## 2016-09-18 NOTE — Telephone Encounter (Signed)
Per Dr Rogue Bussing, can see her tomorrow in [place] at [phone]

## 2016-09-19 ENCOUNTER — Ambulatory Visit
Admission: RE | Admit: 2016-09-19 | Discharge: 2016-09-19 | Disposition: A | Discharge status: Home / Self Care | Payer: Medicare HMO | Source: Ambulatory Visit | Attending: Internal Medicine | Admitting: Internal Medicine

## 2016-09-19 ENCOUNTER — Telehealth: Payer: Self-pay | Admitting: Internal Medicine

## 2016-09-19 ENCOUNTER — Inpatient Hospital Stay: Payer: Medicare HMO

## 2016-09-19 ENCOUNTER — Inpatient Hospital Stay: Payer: Medicare HMO | Attending: Internal Medicine | Admitting: Internal Medicine

## 2016-09-19 VITALS — BP 190/73 | HR 93 | Temp 97.7°F | Wt 139.7 lb

## 2016-09-19 DIAGNOSIS — C3432 Malignant neoplasm of lower lobe, left bronchus or lung: Secondary | ICD-10-CM

## 2016-09-19 DIAGNOSIS — J449 Chronic obstructive pulmonary disease, unspecified: Secondary | ICD-10-CM | POA: Diagnosis not present

## 2016-09-19 DIAGNOSIS — F0390 Unspecified dementia without behavioral disturbance: Secondary | ICD-10-CM | POA: Diagnosis not present

## 2016-09-19 DIAGNOSIS — J849 Interstitial pulmonary disease, unspecified: Secondary | ICD-10-CM | POA: Insufficient documentation

## 2016-09-19 DIAGNOSIS — I700 Atherosclerosis of aorta: Secondary | ICD-10-CM | POA: Insufficient documentation

## 2016-09-19 DIAGNOSIS — Z8673 Personal history of transient ischemic attack (TIA), and cerebral infarction without residual deficits: Secondary | ICD-10-CM | POA: Diagnosis not present

## 2016-09-19 DIAGNOSIS — Z7952 Long term (current) use of systemic steroids: Secondary | ICD-10-CM

## 2016-09-19 DIAGNOSIS — R079 Chest pain, unspecified: Secondary | ICD-10-CM | POA: Insufficient documentation

## 2016-09-19 DIAGNOSIS — G4700 Insomnia, unspecified: Secondary | ICD-10-CM | POA: Diagnosis not present

## 2016-09-19 DIAGNOSIS — Z853 Personal history of malignant neoplasm of breast: Secondary | ICD-10-CM | POA: Diagnosis not present

## 2016-09-19 DIAGNOSIS — R0602 Shortness of breath: Secondary | ICD-10-CM | POA: Insufficient documentation

## 2016-09-19 DIAGNOSIS — Z87891 Personal history of nicotine dependence: Secondary | ICD-10-CM | POA: Diagnosis not present

## 2016-09-19 DIAGNOSIS — Z79899 Other long term (current) drug therapy: Secondary | ICD-10-CM | POA: Diagnosis not present

## 2016-09-19 DIAGNOSIS — R351 Nocturia: Secondary | ICD-10-CM | POA: Insufficient documentation

## 2016-09-19 DIAGNOSIS — R918 Other nonspecific abnormal finding of lung field: Secondary | ICD-10-CM | POA: Diagnosis not present

## 2016-09-19 DIAGNOSIS — I10 Essential (primary) hypertension: Secondary | ICD-10-CM | POA: Diagnosis not present

## 2016-09-19 DIAGNOSIS — Z923 Personal history of irradiation: Secondary | ICD-10-CM | POA: Insufficient documentation

## 2016-09-19 DIAGNOSIS — J479 Bronchiectasis, uncomplicated: Secondary | ICD-10-CM | POA: Insufficient documentation

## 2016-09-19 DIAGNOSIS — J90 Pleural effusion, not elsewhere classified: Secondary | ICD-10-CM | POA: Diagnosis not present

## 2016-09-19 DIAGNOSIS — J700 Acute pulmonary manifestations due to radiation: Secondary | ICD-10-CM | POA: Insufficient documentation

## 2016-09-19 LAB — CBC WITH DIFFERENTIAL/PLATELET
BASOS ABS: 0 10*3/uL (ref 0–0.1)
Basophils Relative: 1 %
Eosinophils Absolute: 0 10*3/uL (ref 0–0.7)
Eosinophils Relative: 0 %
HEMATOCRIT: 33.1 % — AB (ref 35.0–47.0)
HEMOGLOBIN: 10.2 g/dL — AB (ref 12.0–16.0)
LYMPHS PCT: 12 %
Lymphs Abs: 1.1 10*3/uL (ref 1.0–3.6)
MCH: 21.2 pg — ABNORMAL LOW (ref 26.0–34.0)
MCHC: 30.9 g/dL — ABNORMAL LOW (ref 32.0–36.0)
MCV: 68.8 fL — AB (ref 80.0–100.0)
Monocytes Absolute: 0.7 10*3/uL (ref 0.2–0.9)
Monocytes Relative: 7 %
NEUTROS ABS: 7.4 10*3/uL — AB (ref 1.4–6.5)
NEUTROS PCT: 80 %
PLATELETS: 540 10*3/uL — AB (ref 150–440)
RBC: 4.81 MIL/uL (ref 3.80–5.20)
RDW: 18.3 % — ABNORMAL HIGH (ref 11.5–14.5)
WBC: 9.3 10*3/uL (ref 3.6–11.0)

## 2016-09-19 LAB — COMPREHENSIVE METABOLIC PANEL
ALK PHOS: 78 U/L (ref 38–126)
ALT: 9 U/L — AB (ref 14–54)
AST: 18 U/L (ref 15–41)
Albumin: 3.7 g/dL (ref 3.5–5.0)
Anion gap: 10 (ref 5–15)
BILIRUBIN TOTAL: 0.3 mg/dL (ref 0.3–1.2)
BUN: 15 mg/dL (ref 6–20)
CO2: 26 mmol/L (ref 22–32)
Calcium: 9.3 mg/dL (ref 8.9–10.3)
Chloride: 99 mmol/L — ABNORMAL LOW (ref 101–111)
Creatinine, Ser: 1.17 mg/dL — ABNORMAL HIGH (ref 0.44–1.00)
GFR calc Af Amer: 47 mL/min — ABNORMAL LOW (ref >60)
GFR calc non Af Amer: 41 mL/min — ABNORMAL LOW (ref >60)
GLUCOSE: 117 mg/dL — AB (ref 65–99)
POTASSIUM: 3.7 mmol/L (ref 3.5–5.1)
Sodium: 135 mmol/L (ref 135–145)
TOTAL PROTEIN: 8.4 g/dL — AB (ref 6.5–8.1)

## 2016-09-19 LAB — IRON AND TIBC
Iron: 16 ug/dL — ABNORMAL LOW (ref 28–170)
Saturation Ratios: 4 % — ABNORMAL LOW (ref 10.4–31.8)
TIBC: 396 ug/dL (ref 250–450)
UIBC: 380 ug/dL

## 2016-09-19 LAB — FERRITIN: Ferritin: 13 ng/mL (ref 11–307)

## 2016-09-19 LAB — LACTATE DEHYDROGENASE: LDH: 154 U/L (ref 98–192)

## 2016-09-19 MED ORDER — AMOXICILLIN-POT CLAVULANATE 875-125 MG PO TABS: 1.0000 | ORAL_TABLET | Freq: Two times a day (BID) | ORAL | 0 refills | Status: DC

## 2016-09-19 MED ORDER — ALBUTEROL SULFATE HFA 108 (90 BASE) MCG/ACT IN AERS: 2.0000 | INHALATION_SPRAY | Freq: Four times a day (QID) | RESPIRATORY_TRACT | 2 refills | Status: AC | PRN

## 2016-09-19 MED ORDER — PREDNISONE 20 MG PO TABS: ORAL_TABLET | ORAL | 0 refills | Status: DC

## 2016-09-19 NOTE — Assessment & Plan Note (Addendum)
#   Squamous cell carcinoma stage I; poor surgical candidate. S/p RT [oct 2017]. See discussion below  # left chest wall pain/ cough/ dyspnea- ? Radiation pneumonitis- recommend CXR [clinically less likely PE as pt is on eliquis ]. Get CT Chest with contrast. Start prednisone '60mg'$ /day; Albuterol inhaler; augumentin.   # Anemia microcytic anemia of chronic disease versus iron deficiency. Check CBC CMP and LDH. Will iron studies.   # Follow up in 1 week. [[phone]/ denise; daughter]. Plan of care was discussed with the patient's daughter in detail.

## 2016-09-19 NOTE — Progress Notes (Signed)
Patient c/o fever, left breast pain, left rib cage pain, left shoulder pain, pain when exertion, started in may but began getting unbarable about 1 month ago.

## 2016-09-19 NOTE — Telephone Encounter (Signed)
Spoke to pt's daughter/ ? Radiation pneumonitis vs others- continue current treatment plan await CT scan as planned for next week.

## 2016-09-19 NOTE — Progress Notes (Signed)
[facility] NOTE  Patient Care Team: Denita Lung, DO as PCP - General (Internal Medicine)  CHIEF COMPLAINTS/PURPOSE OF CONSULTATION:  Oncology History   # left lower lobe mass, which measures 4.3 x 3.1 cm (4:43). 1.4 cm right lower lobe peripheral nodular opacity is unchanged (4:40  # 1998 Breast cancer- s/p Stage I lumpec  & RT. No chemo; no pills     Primary cancer of left lower lobe of lung ([place])   04/13/2016 Initial Diagnosis    Primary cancer of left lower lobe of lung ([place])       #  Oncology History   # left lower lobe mass, which measures 4.3 x 3.1 cm (4:43). 1.4 cm right lower lobe peripheral nodular opacity is unchanged (4:40  # 1998 Breast cancer- s/p Stage I lumpec  & RT. No chemo; no pills     Primary cancer of left lower lobe of lung ([place])   04/13/2016 Initial Diagnosis    Primary cancer of left lower lobe of lung ([place])       HISTORY OF PRESENTING ILLNESS:  Melissa Palmer 83 y.o.  female with left lower lobe stage I squamous cell carcinoma status post radiation finish in October 2017- is here to be evaluated for left chest wall pain; shortness of breath and cough.  Patient states over the last 2 months she noted to have worsening shortness of breath and cough. Chest pain with taking deep breaths. Fairly intense. Denies any phlegm or hemoptysis. Appetite is fair.  Patient has been treated with ciprofloxacin and azithromycin twice a PCP. Not significant improvement noted. She is awaiting to get a CT scan through PCP  ROS: A complete 10 point review of system is done which is negative except mentioned above in history of present illness  MEDICAL HISTORY:  Past Medical History:  Diagnosis Date  . Cancer ([place])   . Cataract   . Dementia   . Hypertension   . TIA (transient ischemic attack)     SURGICAL HISTORY: Past Surgical History:  Procedure Laterality Date  . BREAST SURGERY    . COLONOSCOPY    . HYSTEROTOMY    . UPPER GI  ENDOSCOPY      SOCIAL HISTORY: Social History   Social History  . Marital status: Married    Spouse name: N/A  . Number of children: N/A  . Years of education: N/A   Occupational History  . Not on file.   Social History Main Topics  . Smoking status: Former Research scientist (life sciences)  . Smokeless tobacco: Never Used  . Alcohol use No  . Drug use: No  . Sexual activity: Not on file   Other Topics Concern  . Not on file   Social History Narrative  . No narrative on file    FAMILY HISTORY: Heart disease in family. No family history on file.  ALLERGIES:  is allergic to asa [aspirin]; flagyl [metronidazole]; hydrochlorothiazide; and sulfa antibiotics.  MEDICATIONS:  Current Outpatient Prescriptions  Medication Sig Dispense Refill  . acetaminophen-codeine (TYLENOL #3) 300-30 MG tablet Take 1 tablet by mouth every 4 (four) hours as needed for moderate pain or severe pain.     Marland Kitchen ALPRAZolam (XANAX) 0.5 MG tablet Take 0.5-1 mg by mouth 2 (two) times daily. Take 0.'25mg'$  in morning and take 0.'5mg'$  at night    . apixaban (ELIQUIS) 2.5 MG TABS tablet Take 1 tablet (2.5 mg total) by mouth 2 (two) times daily. 60 tablet 0  . cloNIDine (CATAPRES) 0.1  MG tablet Take 0.1 mg by mouth at bedtime.    Marland Kitchen diltiazem (CARDIZEM CD) 360 MG 24 hr capsule Take 360 mg by mouth daily.    . fluvoxaMINE (LUVOX) 50 MG tablet Take 25 mg by mouth daily at 12 noon.    . loratadine (CLARITIN) 10 MG tablet Take 10 mg by mouth daily.    . pantoprazole (PROTONIX) 20 MG tablet Take 20 mg by mouth daily.    Marland Kitchen albuterol (PROVENTIL HFA;VENTOLIN HFA) 108 (90 Base) MCG/ACT inhaler Inhale 2 puffs into the lungs every 6 (six) hours as needed for wheezing or shortness of breath. 1 Inhaler 2  . amoxicillin-clavulanate (AUGMENTIN) 875-125 MG tablet Take 1 tablet by mouth 2 (two) times daily. 30 tablet 0  . predniSONE (DELTASONE) 20 MG tablet Take 3 pills once a day; with food; in AM. 50 tablet 0   No current facility-administered medications  for this visit.       Marland Kitchen  PHYSICAL EXAMINATION: ECOG PERFORMANCE STATUS: 1 - Symptomatic but completely ambulatory  Vitals:   09/19/16 0849  BP: (!) 190/73  Pulse: 93  Temp: 97.7 F (36.5 C)   Filed Weights   09/19/16 0849  Weight: 139 lb 10.6 oz (63.3 kg)    GENERAL: Well-nourished well-developed; Alert, no distress and comfortable.    EYAccompanied by daughter.ES: no pallor or icterus OROPHARYNX: no thrush or ulceration;   NECK: supple, no masses felt LYMPH:  no palpable lymphadenopathy in the cervical, axillary or inguinal regions LUNGS: decreased breath sounds to auscultation and  No wheeze or crackles HEART/CVS: regular rate & rhythm and no murmurs; No lower extremity edema ABDOMEN: abdomen soft, non-tender and normal bowel sounds Musculoskeletal:no cyanosis of digits and no clubbing  PSYCH: alert & oriented x 3 with fluent speech NEURO: no focal motor/sensory deficits SKIN:  no rashes or significant lesions    RADIOGRAPHIC STUDIES: I have personally reviewed the radiological images as listed and agreed with the findings in the report. Dg Chest 2 View  Result Date: 09/19/2016 CLINICAL DATA:  Left-sided chest pain with fever and cough. History right lung cancer with radiation therapy. EXAM: CHEST  2 VIEW COMPARISON:  Radiographs dated 04/24/2016 and 03/07/2016 FINDINGS: There is a 5.2 cm masslike density at the left lung base in an area of previous pulmonary parenchymal scarring. There scarring at the right lung base is well. Multiple surgical clips in the right mid lung zone. Heart size and pulmonary vascularity are normal. Calcification in the arch of the aorta. Stable bilateral apical pleural thickening, right more than left. Previous resection of the right fifth rib. IMPRESSION: Masslike density at the left lung base posteriorly. This could represent pneumonia but given the patient's history, the possibility of malignancy should be considered. Chronic interstitial and  obstructive lung disease. Aortic atherosclerosis. Electronically Signed   By: Lorriane Shire M.D.   On: 09/19/2016 10:18    ASSESSMENT & PLAN:   Primary cancer of left lower lobe of lung ([place]) # Squamous cell carcinoma stage I; poor surgical candidate. S/p RT [oct 2017]. See discussion below  # left chest wall pain/ cough/ dyspnea- ? Radiation pneumonitis- recommend CXR [clinically less likely PE as pt is on eliquis ]. Get CT Chest with contrast. Start prednisone '60mg'$ /day; Albuterol inhaler; augumentin.   # Anemia microcytic anemia of chronic disease versus iron deficiency. Check CBC CMP and LDH. Will iron studies.   # Follow up in 1 week. [[phone]/ denise; daughter]. Plan of care was discussed with the patient's  daughter in detail.      Cammie Sickle, MD 09/19/2016 1:38 PM

## 2016-09-20 ENCOUNTER — Telehealth: Payer: Self-pay | Admitting: *Deleted

## 2016-09-20 ENCOUNTER — Other Ambulatory Visit: Payer: Self-pay | Admitting: Internal Medicine

## 2016-09-20 ENCOUNTER — Encounter: Payer: Self-pay | Admitting: *Deleted

## 2016-09-20 DIAGNOSIS — D500 Iron deficiency anemia secondary to blood loss (chronic): Secondary | ICD-10-CM | POA: Insufficient documentation

## 2016-09-20 NOTE — Telephone Encounter (Signed)
-----   Message from Cammie Sickle, MD sent at 09/20/2016  8:41 AM EST ----- Please inform pt/daughter that she is low on iron- would recommend IV iron; at next visit. Dr.B

## 2016-09-20 NOTE — Telephone Encounter (Signed)
Left msg for daughter to contact cancer ctr to discuss results. Also sent sch. Team in [place] to add pt to schedule for IV iron next Tuesday in [place] s/p md visit.

## 2016-09-20 NOTE — Progress Notes (Signed)
Left vm for daughter regarding her iron infusion next week.

## 2016-09-26 ENCOUNTER — Inpatient Hospital Stay (HOSPITAL_BASED_OUTPATIENT_CLINIC_OR_DEPARTMENT_OTHER): Payer: Medicare HMO | Admitting: Internal Medicine

## 2016-09-26 ENCOUNTER — Ambulatory Visit
Admission: RE | Admit: 2016-09-26 | Discharge: 2016-09-26 | Disposition: A | Discharge status: Home / Self Care | Payer: Medicare HMO | Source: Ambulatory Visit | Attending: Radiation Oncology | Admitting: Radiation Oncology

## 2016-09-26 ENCOUNTER — Inpatient Hospital Stay: Payer: Medicare HMO

## 2016-09-26 VITALS — BP 195/66 | HR 97 | Temp 98.0°F | Wt 141.2 lb

## 2016-09-26 DIAGNOSIS — Z923 Personal history of irradiation: Secondary | ICD-10-CM

## 2016-09-26 DIAGNOSIS — R079 Chest pain, unspecified: Secondary | ICD-10-CM | POA: Diagnosis not present

## 2016-09-26 DIAGNOSIS — I700 Atherosclerosis of aorta: Secondary | ICD-10-CM

## 2016-09-26 DIAGNOSIS — C3432 Malignant neoplasm of lower lobe, left bronchus or lung: Secondary | ICD-10-CM

## 2016-09-26 DIAGNOSIS — R0602 Shortness of breath: Secondary | ICD-10-CM

## 2016-09-26 DIAGNOSIS — J90 Pleural effusion, not elsewhere classified: Secondary | ICD-10-CM | POA: Insufficient documentation

## 2016-09-26 DIAGNOSIS — J849 Interstitial pulmonary disease, unspecified: Secondary | ICD-10-CM

## 2016-09-26 DIAGNOSIS — Z79899 Other long term (current) drug therapy: Secondary | ICD-10-CM

## 2016-09-26 DIAGNOSIS — J439 Emphysema, unspecified: Secondary | ICD-10-CM | POA: Insufficient documentation

## 2016-09-26 DIAGNOSIS — I10 Essential (primary) hypertension: Secondary | ICD-10-CM | POA: Diagnosis not present

## 2016-09-26 DIAGNOSIS — R351 Nocturia: Secondary | ICD-10-CM

## 2016-09-26 DIAGNOSIS — Z7952 Long term (current) use of systemic steroids: Secondary | ICD-10-CM

## 2016-09-26 DIAGNOSIS — Z853 Personal history of malignant neoplasm of breast: Secondary | ICD-10-CM

## 2016-09-26 DIAGNOSIS — D500 Iron deficiency anemia secondary to blood loss (chronic): Secondary | ICD-10-CM

## 2016-09-26 DIAGNOSIS — J479 Bronchiectasis, uncomplicated: Secondary | ICD-10-CM | POA: Diagnosis not present

## 2016-09-26 DIAGNOSIS — J700 Acute pulmonary manifestations due to radiation: Secondary | ICD-10-CM

## 2016-09-26 DIAGNOSIS — Z87891 Personal history of nicotine dependence: Secondary | ICD-10-CM

## 2016-09-26 DIAGNOSIS — I2510 Atherosclerotic heart disease of native coronary artery without angina pectoris: Secondary | ICD-10-CM | POA: Diagnosis not present

## 2016-09-26 DIAGNOSIS — F0390 Unspecified dementia without behavioral disturbance: Secondary | ICD-10-CM

## 2016-09-26 DIAGNOSIS — G4700 Insomnia, unspecified: Secondary | ICD-10-CM

## 2016-09-26 DIAGNOSIS — Z8673 Personal history of transient ischemic attack (TIA), and cerebral infarction without residual deficits: Secondary | ICD-10-CM

## 2016-09-26 MED ORDER — SODIUM CHLORIDE 0.9 % IV SOLN
Freq: Once | INTRAVENOUS | Status: AC
  Administered 2016-09-26: 12:00:00 via INTRAVENOUS
  Filled 2016-09-26: qty 1000

## 2016-09-26 MED ORDER — PREDNISONE 20 MG PO TABS: ORAL_TABLET | ORAL | 0 refills | Status: DC

## 2016-09-26 MED ORDER — IOPAMIDOL (ISOVUE-300) INJECTION 61%
75.0000 mL | Freq: Once | INTRAVENOUS | Status: AC | PRN
  Administered 2016-09-26: 75 mL via INTRAVENOUS

## 2016-09-26 MED ORDER — IRON SUCROSE 20 MG/ML IV SOLN
200.0000 mg | Freq: Once | INTRAVENOUS | Status: AC
  Administered 2016-09-26: 200 mg via INTRAVENOUS
  Filled 2016-09-26: qty 10

## 2016-09-26 MED ORDER — SODIUM CHLORIDE 0.9 % IV SOLN: 200.0000 mg | Freq: Once | INTRAVENOUS | Status: DC

## 2016-09-26 NOTE — Progress Notes (Signed)
[facility] NOTE  Patient Care Team: Denita Lung, DO as PCP - General (Internal Medicine)  CHIEF COMPLAINTS/PURPOSE OF CONSULTATION:  Oncology History   # left lower lobe mass, which measures 4.3 x 3.1 cm (4:43). 1.4 cm right lower lobe peripheral nodular opacity is unchanged (4:40  # 1998 Breast cancer- s/p Stage I lumpec  & RT. No chemo; no pills     Primary cancer of left lower lobe of lung ([place])   04/13/2016 Initial Diagnosis    Primary cancer of left lower lobe of lung ([place])       #  Oncology History   # left lower lobe mass, which measures 4.3 x 3.1 cm (4:43). 1.4 cm right lower lobe peripheral nodular opacity is unchanged (4:40  # 1998 Breast cancer- s/p Stage I lumpec  & RT. No chemo; no pills     Primary cancer of left lower lobe of lung ([place])   04/13/2016 Initial Diagnosis    Primary cancer of left lower lobe of lung ([place])       HISTORY OF PRESENTING ILLNESS:  Melissa Palmer 83 y.o.  female with left lower lobe stage I squamous cell carcinoma status post radiation finish in October 2017- is here to be evaluated for left chest wall pain; shortness of breath and cough.  Patient states over the last 2 months she noted to have worsening shortness of breath and cough. Chest pain with taking deep breaths. Fairly intense. Denies any phlegm or hemoptysis. Appetite is fair.  Patient has been treated with ciprofloxacin and azithromycin twice a PCP. Not significant improvement noted. She is awaiting to get a CT scan through PCP  ROS: A complete 10 point review of system is done which is negative except mentioned above in history of present illness  MEDICAL HISTORY:  Past Medical History:  Diagnosis Date  . Cancer ([place])   . Cataract   . Dementia   . Hypertension   . TIA (transient ischemic attack)     SURGICAL HISTORY: Past Surgical History:  Procedure Laterality Date  . BREAST SURGERY    . COLONOSCOPY    . HYSTEROTOMY    . UPPER GI  ENDOSCOPY      SOCIAL HISTORY: Social History   Social History  . Marital status: Married    Spouse name: N/A  . Number of children: N/A  . Years of education: N/A   Occupational History  . Not on file.   Social History Main Topics  . Smoking status: Former Research scientist (life sciences)  . Smokeless tobacco: Never Used  . Alcohol use No  . Drug use: No  . Sexual activity: Not on file   Other Topics Concern  . Not on file   Social History Narrative  . No narrative on file    FAMILY HISTORY: Heart disease in family. No family history on file.  ALLERGIES:  is allergic to asa [aspirin]; flagyl [metronidazole]; hydrochlorothiazide; and sulfa antibiotics.  MEDICATIONS:  Current Outpatient Prescriptions  Medication Sig Dispense Refill  . albuterol (PROVENTIL HFA;VENTOLIN HFA) 108 (90 Base) MCG/ACT inhaler Inhale 2 puffs into the lungs every 6 (six) hours as needed for wheezing or shortness of breath. 1 Inhaler 2  . ALPRAZolam (XANAX) 0.5 MG tablet Take 0.5-1 mg by mouth 2 (two) times daily. Take 0.'25mg'$  in morning and take 0.'5mg'$  at night    . amoxicillin-clavulanate (AUGMENTIN) 875-125 MG tablet Take 1 tablet by mouth 2 (two) times daily. 30 tablet 0  . apixaban (ELIQUIS) 2.5  MG TABS tablet Take 1 tablet (2.5 mg total) by mouth 2 (two) times daily. 60 tablet 0  . cloNIDine (CATAPRES) 0.1 MG tablet Take 0.1 mg by mouth at bedtime.    Marland Kitchen diltiazem (CARDIZEM CD) 360 MG 24 hr capsule Take 360 mg by mouth daily.    . fluvoxaMINE (LUVOX) 50 MG tablet Take 25 mg by mouth daily at 12 noon.    . loratadine (CLARITIN) 10 MG tablet Take 10 mg by mouth daily.    . pantoprazole (PROTONIX) 20 MG tablet Take 20 mg by mouth daily.    . predniSONE (DELTASONE) 20 MG tablet Take 3 pills once a day; with food; in AM. 80 tablet 0  . acetaminophen-codeine (TYLENOL #3) 300-30 MG tablet Take 1 tablet by mouth every 4 (four) hours as needed for moderate pain or severe pain.      No current facility-administered medications  for this visit.       Marland Kitchen  PHYSICAL EXAMINATION: ECOG PERFORMANCE STATUS: 1 - Symptomatic but completely ambulatory  Vitals:   09/26/16 1050  BP: (!) 195/66  Pulse: 97  Temp: 98 F (36.7 C)   Filed Weights   09/26/16 1050  Weight: 141 lb 3.3 oz (64 kg)    GENERAL: Well-nourished well-developed; Alert, no distress and comfortable.    EYAccompanied by daughter.ES: no pallor or icterus OROPHARYNX: no thrush or ulceration;   NECK: supple, no masses felt LYMPH:  no palpable lymphadenopathy in the cervical, axillary or inguinal regions LUNGS: decreased breath sounds to auscultation and  No wheeze or crackles HEART/CVS: regular rate & rhythm and no murmurs; No lower extremity edema ABDOMEN: abdomen soft, non-tender and normal bowel sounds Musculoskeletal:no cyanosis of digits and no clubbing  PSYCH: alert & oriented x 3 with fluent speech NEURO: no focal motor/sensory deficits SKIN:  no rashes or significant lesions    RADIOGRAPHIC STUDIES: I have personally reviewed the radiological images as listed and agreed with the findings in the report. Dg Chest 2 View  Result Date: 09/19/2016 CLINICAL DATA:  Left-sided chest pain with fever and cough. History right lung cancer with radiation therapy. EXAM: CHEST  2 VIEW COMPARISON:  Radiographs dated 04/24/2016 and 03/07/2016 FINDINGS: There is a 5.2 cm masslike density at the left lung base in an area of previous pulmonary parenchymal scarring. There scarring at the right lung base is well. Multiple surgical clips in the right mid lung zone. Heart size and pulmonary vascularity are normal. Calcification in the arch of the aorta. Stable bilateral apical pleural thickening, right more than left. Previous resection of the right fifth rib. IMPRESSION: Masslike density at the left lung base posteriorly. This could represent pneumonia but given the patient's history, the possibility of malignancy should be considered. Chronic interstitial and  obstructive lung disease. Aortic atherosclerosis. Electronically Signed   By: Lorriane Shire M.D.   On: 09/19/2016 10:18   Ct Chest W Contrast  Result Date: 09/26/2016 CLINICAL DATA:  Followup lung cancer.  Status post radiation. EXAM: CT CHEST WITH CONTRAST TECHNIQUE: Multidetector CT imaging of the chest was performed during intravenous contrast administration. CONTRAST:  81m ISOVUE-300 IOPAMIDOL (ISOVUE-300) INJECTION 61% COMPARISON:  None. FINDINGS: Cardiovascular: The heart size appears mildly enlarged. There is aortic atherosclerosis. Calcifications in the LAD, left circumflex and RCA coronary arteries. Mediastinum/Nodes: There is no axillary or supraclavicular adenopathy. Lungs/Pleura: There are moderate changes of centrilobular emphysema. Changes of interstitial lung disease are identified with basilar and peripheral honeycombing and traction bronchiectasis. There is a small  to moderate left pleural effusion identified. Mass containing fiducial markers within the left lower lobe measures 3.4 x 5.0 by 3.6 cm. Surrounding changes of external beam radiation identified including airspace consolidation and fibrosis. Nodular density within the right lower lobe is identified measuring 1.6 cm, image 107 of series 3. Nonspecific. Upper Abdomen: No acute abnormality. Musculoskeletal: No chest wall abnormality. No acute or significant osseous findings. IMPRESSION: 1. Left lower lobe lung mass containing fiducial markers identified as above. Findings suspicious for residual tumor. If previous examinations are available for side-by-side comparison this may be helpful to assess for interval response to therapy. 2. There are changes of external beam radiation within the left lower lobe surrounding the left lung mass. 3. Small left pleural effusion 4. Emphysema 5. Suspect chronic interstitial lung disease with lower lobe predominant interstitial honeycombing and traction bronchiectasis 6. Aortic atherosclerosis and  multi vessel coronary artery calcification. Electronically Signed   By: Kerby Moors M.D.   On: 09/26/2016 11:20    ASSESSMENT & PLAN:   Primary cancer of left lower lobe of lung ([place]) # Squamous cell carcinoma stage I; poor surgical candidate. S/p RT [oct 2017]. See discussion below  # left chest wall pain/ cough/ dyspnea- ? CT revealed small left pleural effusion and left lower lobe changes from radiation vs residual tumor?  Most likely radiation pneumonitis.  Patient on Eliquis.  Very little change in pain with antibiotic course.  Continue prednisone '60mg'$ /day, Albuterol inhaler.  Complete course of augumentin.   # Anemia microcytic anemia of chronic disease versus iron deficiency. Iron level on 09/19/16 was 16, ferritin 13.  Administer IV iron today, and recheck labs in 2 weeks. Check CBC CMP LDH and iron studies.   % Insomia/Nocturia: Discussed sleep hygiene: dark bedroom, discontinuing screen (computer, phone, tablet, T.V.) usage 1-2 hours prior to bedtime, not eating or drinking 2 hours prior to bedtime, and limiting daytime naps to 20-45 minutes.   # Follow up in 2 weeks. [[phone]/ Langley Gauss, daughter]. Plan of care was discussed with the patient's daughter in detail.      Cammie Sickle, MD 10/10/2016 8:13 AM

## 2016-09-26 NOTE — Progress Notes (Signed)
Explained Venofer treatment and side effects are dark urine and darker bowel movements, possibility of a headache and or back ache.  Patient instructed to take Tylenol for any pain.

## 2016-09-26 NOTE — Progress Notes (Signed)
[facility] NOTE  Patient Care Team: Denita Lung, DO as PCP - General (Internal Medicine)  CHIEF COMPLAINTS/PURPOSE OF CONSULTATION:  Oncology History   # left lower lobe mass, which measures 4.3 x 3.1 cm (4:43). 1.4 cm right lower lobe peripheral nodular opacity is unchanged (4:40  # 1998 Breast cancer- s/p Stage I lumpec  & RT. No chemo; no pills     Primary cancer of left lower lobe of lung ([place])   04/13/2016 Initial Diagnosis    Primary cancer of left lower lobe of lung ([place])       #  Oncology History   # left lower lobe mass, which measures 4.3 x 3.1 cm (4:43). 1.4 cm right lower lobe peripheral nodular opacity is unchanged (4:40  # 1998 Breast cancer- s/p Stage I lumpec  & RT. No chemo; no pills     Primary cancer of left lower lobe of lung ([place])   04/13/2016 Initial Diagnosis    Primary cancer of left lower lobe of lung ([facility])       HISTORY OF PRESENTING ILLNESS:  Melissa Palmer 83 y.o.  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  Patient states that pain in epigastric region is chronically 10/10, and now the pain radiates to her back and "feels like my back is on fire, all the way down my back".  She has been alternating Norco and tylenol, which reduces her pain to 3/10 for a couple of hours.  She also reports 5/10 chest pain.  Appetite has improved and she denies weight loss.  She reports fatigue, weakness, night sweats, chills, and fever up to 100.5.  She reports ongoing dyspnea on exertion.  She reports her cough has improved.  She reports constipation, relieved by taking miralax.  She denies nausea, vomiting, diarrhea, hematuria, or hematochezia.  She reports ongoing nocturia and incontinence, being followed by PCP.  She reports psoriasis, being followed by  dermatology.  Patient has been treated with ciprofloxacin and azithromycin twice by PCP, currently on augmentin. No significant improvement noted. CT scan was ordered through PCP.  ROS: Review of Systems  Constitutional: Positive for chills, diaphoresis, fever and malaise/fatigue. Negative for weight loss.  HENT: Negative for congestion.   Respiratory: Positive for cough and shortness of breath.        Dyspnea on exertion  Cardiovascular: Positive for chest pain.  Gastrointestinal: Positive for abdominal pain and constipation. Negative for blood in stool, diarrhea, nausea and vomiting.  Genitourinary: Positive for frequency and urgency. Negative for hematuria.  Musculoskeletal: Positive for back pain and falls.  Skin:       Psoriasis  Neurological: Positive for weakness. Negative for tingling and headaches.  Psychiatric/Behavioral: The patient is nervous/anxious and has insomnia.      MEDICAL HISTORY:  Past Medical History:  Diagnosis Date  . Cancer ([place])   . Cataract   . Dementia   . Hypertension   . TIA (transient ischemic attack)     SURGICAL HISTORY: Past Surgical History:  Procedure Laterality Date  . BREAST SURGERY    . COLONOSCOPY    . HYSTEROTOMY    . UPPER GI ENDOSCOPY      SOCIAL HISTORY: Social History   Social History  . Marital status: Married    Spouse name: N/A  .  Number of children: N/A  . Years of education: N/A   Occupational History  . Not on file.   Social History Main Topics  . Smoking status: Former Research scientist (life sciences)  . Smokeless tobacco: Never Used  . Alcohol use No  . Drug use: No  . Sexual activity: Not on file   Other Topics Concern  . Not on file   Social History Narrative  . No narrative on file    FAMILY HISTORY: Heart disease in family. No family history on file.  ALLERGIES:  is allergic to asa [aspirin]; flagyl [metronidazole]; hydrochlorothiazide; and sulfa antibiotics.  MEDICATIONS:  Current Outpatient Prescriptions   Medication Sig Dispense Refill  . albuterol (PROVENTIL HFA;VENTOLIN HFA) 108 (90 Base) MCG/ACT inhaler Inhale 2 puffs into the lungs every 6 (six) hours as needed for wheezing or shortness of breath. 1 Inhaler 2  . ALPRAZolam (XANAX) 0.5 MG tablet Take 0.5-1 mg by mouth 2 (two) times daily. Take 0.'25mg'$  in morning and take 0.'5mg'$  at night    . amoxicillin-clavulanate (AUGMENTIN) 875-125 MG tablet Take 1 tablet by mouth 2 (two) times daily. 30 tablet 0  . apixaban (ELIQUIS) 2.5 MG TABS tablet Take 1 tablet (2.5 mg total) by mouth 2 (two) times daily. 60 tablet 0  . cloNIDine (CATAPRES) 0.1 MG tablet Take 0.1 mg by mouth at bedtime.    Marland Kitchen diltiazem (CARDIZEM CD) 360 MG 24 hr capsule Take 360 mg by mouth daily.    . fluvoxaMINE (LUVOX) 50 MG tablet Take 25 mg by mouth daily at 12 noon.    . loratadine (CLARITIN) 10 MG tablet Take 10 mg by mouth daily.    . pantoprazole (PROTONIX) 20 MG tablet Take 20 mg by mouth daily.    . predniSONE (DELTASONE) 20 MG tablet Take 3 pills once a day; with food; in AM. 80 tablet 0  . acetaminophen-codeine (TYLENOL #3) 300-30 MG tablet Take 1 tablet by mouth every 4 (four) hours as needed for moderate pain or severe pain.      No current facility-administered medications for this visit.       Marland Kitchen  PHYSICAL EXAMINATION: ECOG PERFORMANCE STATUS: 1 - Symptomatic but completely ambulatory  Vitals:   09/26/16 1050  BP: (!) 195/66  Pulse: 97  Temp: 98 F (36.7 C)   Filed Weights   09/26/16 1050  Weight: 141 lb 3.3 oz (64 kg)    GENERAL: Well-nourished well-developed; Alert, no distress and comfortable.    EYAccompanied by daughter.ES: no pallor or icterus OROPHARYNX: no thrush or ulceration;   NECK: supple, no masses felt LYMPH:  no palpable lymphadenopathy in the cervical, axillary or inguinal regions LUNGS: decreased breath sounds to auscultation and  No wheeze or crackles HEART/CVS: regular rate & rhythm and no murmurs; No lower extremity edema ABDOMEN:  abdomen soft, non-tender and normal bowel sounds Musculoskeletal:no cyanosis of digits and no clubbing  PSYCH: alert & oriented x 3 with fluent speech NEURO: no focal motor/sensory deficits SKIN:  no rashes or significant lesions    RADIOGRAPHIC STUDIES: I have personally reviewed the radiological images as listed and agreed with the findings in the report. Dg Chest 2 View  Result Date: 09/19/2016 CLINICAL DATA:  Left-sided chest pain with fever and cough. History right lung cancer with radiation therapy. EXAM: CHEST  2 VIEW COMPARISON:  Radiographs dated 04/24/2016 and 03/07/2016 FINDINGS: There is a 5.2 cm masslike density at the left lung base in an area of previous pulmonary parenchymal scarring. There scarring at the  right lung base is well. Multiple surgical clips in the right mid lung zone. Heart size and pulmonary vascularity are normal. Calcification in the arch of the aorta. Stable bilateral apical pleural thickening, right more than left. Previous resection of the right fifth rib. IMPRESSION: Masslike density at the left lung base posteriorly. This could represent pneumonia but given the patient's history, the possibility of malignancy should be considered. Chronic interstitial and obstructive lung disease. Aortic atherosclerosis. Electronically Signed   By: Lorriane Shire M.D.   On: 09/19/2016 10:18   Ct Chest W Contrast  Result Date: 09/26/2016 CLINICAL DATA:  Followup lung cancer.  Status post radiation. EXAM: CT CHEST WITH CONTRAST TECHNIQUE: Multidetector CT imaging of the chest was performed during intravenous contrast administration. CONTRAST:  27m ISOVUE-300 IOPAMIDOL (ISOVUE-300) INJECTION 61% COMPARISON:  None. FINDINGS: Cardiovascular: The heart size appears mildly enlarged. There is aortic atherosclerosis. Calcifications in the LAD, left circumflex and RCA coronary arteries. Mediastinum/Nodes: There is no axillary or supraclavicular adenopathy. Lungs/Pleura: There are moderate  changes of centrilobular emphysema. Changes of interstitial lung disease are identified with basilar and peripheral honeycombing and traction bronchiectasis. There is a small to moderate left pleural effusion identified. Mass containing fiducial markers within the left lower lobe measures 3.4 x 5.0 by 3.6 cm. Surrounding changes of external beam radiation identified including airspace consolidation and fibrosis. Nodular density within the right lower lobe is identified measuring 1.6 cm, image 107 of series 3. Nonspecific. Upper Abdomen: No acute abnormality. Musculoskeletal: No chest wall abnormality. No acute or significant osseous findings. IMPRESSION: 1. Left lower lobe lung mass containing fiducial markers identified as above. Findings suspicious for residual tumor. If previous examinations are available for side-by-side comparison this may be helpful to assess for interval response to therapy. 2. There are changes of external beam radiation within the left lower lobe surrounding the left lung mass. 3. Small left pleural effusion 4. Emphysema 5. Suspect chronic interstitial lung disease with lower lobe predominant interstitial honeycombing and traction bronchiectasis 6. Aortic atherosclerosis and multi vessel coronary artery calcification. Electronically Signed   By: TKerby MoorsM.D.   On: 09/26/2016 11:20    ASSESSMENT & PLAN:   [place] of [place] # Squamous cell carcinoma stage I; poor surgical candidate. S/p RT [oct 2017]. See discussion below  # left chest wall pain/ cough/ dyspnea- ? CT revealed small left pleural effusion and left lower lobe changes from radiation vs residual tumor?  Most likely radiation pneumonitis.  Patient on Eliquis.  Very little change in pain with antibiotic course.  Continue prednisone '60mg'$ /day, Albuterol inhaler.  Complete course of augumentin.   # Anemia microcytic anemia of chronic disease versus iron deficiency. Iron level on 09/19/16 was  16, ferritin 13.  Administer IV iron today, and recheck labs in 2 weeks. Check CBC CMP LDH and iron studies.   % Insomia/Nocturia: Discussed sleep hygiene: dark bedroom, discontinuing screen (computer, phone, tablet, T.V.) usage 1-2 hours prior to bedtime, not eating or drinking 2 hours prior to bedtime, and limiting daytime naps to 20-45 minutes.   # Follow up in 2 weeks. [[[phone]/DLangley Gauss daughter]. Plan of care was discussed with the patient's daughter in detail.  ALucendia Herrlich NP  09/27/16 9:08 AM

## 2016-09-26 NOTE — Assessment & Plan Note (Addendum)
#   Squamous cell carcinoma stage I; poor surgical candidate. S/p RT [oct 2017]. See discussion below  # left chest wall pain/ cough/ dyspnea- ? CT revealed small left pleural effusion and left lower lobe changes from radiation vs residual tumor?  Most likely radiation pneumonitis.  Patient on Eliquis.  Very little change in pain with antibiotic course.  Continue prednisone '60mg'$ /day, Albuterol inhaler.  Complete course of augumentin.   # Anemia microcytic anemia of chronic disease versus iron deficiency. Iron level on 09/19/16 was 16, ferritin 13.  Administer IV iron today, and recheck labs in 2 weeks. Check CBC CMP LDH and iron studies.   % Insomia/Nocturia: Discussed sleep hygiene: dark bedroom, discontinuing screen (computer, phone, tablet, T.V.) usage 1-2 hours prior to bedtime, not eating or drinking 2 hours prior to bedtime, and limiting daytime naps to 20-45 minutes.   # Follow up in 2 weeks. [[phone]/ Langley Gauss, daughter]. Plan of care was discussed with the patient's daughter in detail.

## 2016-09-26 NOTE — Progress Notes (Signed)
Patient here today for follow up.   

## 2016-10-12 ENCOUNTER — Inpatient Hospital Stay: Payer: Medicare HMO

## 2016-10-12 ENCOUNTER — Inpatient Hospital Stay: Payer: Medicare HMO | Attending: Internal Medicine | Admitting: Internal Medicine

## 2016-10-12 VITALS — BP 190/70 | HR 76 | Temp 97.6°F | Resp 18 | Ht 64.0 in | Wt 136.4 lb

## 2016-10-12 VITALS — BP 176/63 | HR 63 | Resp 18

## 2016-10-12 DIAGNOSIS — J449 Chronic obstructive pulmonary disease, unspecified: Secondary | ICD-10-CM | POA: Insufficient documentation

## 2016-10-12 DIAGNOSIS — R454 Irritability and anger: Secondary | ICD-10-CM | POA: Diagnosis not present

## 2016-10-12 DIAGNOSIS — Z87891 Personal history of nicotine dependence: Secondary | ICD-10-CM | POA: Insufficient documentation

## 2016-10-12 DIAGNOSIS — G4700 Insomnia, unspecified: Secondary | ICD-10-CM

## 2016-10-12 DIAGNOSIS — C3432 Malignant neoplasm of lower lobe, left bronchus or lung: Secondary | ICD-10-CM

## 2016-10-12 DIAGNOSIS — Z79899 Other long term (current) drug therapy: Secondary | ICD-10-CM | POA: Diagnosis not present

## 2016-10-12 DIAGNOSIS — D500 Iron deficiency anemia secondary to blood loss (chronic): Secondary | ICD-10-CM

## 2016-10-12 DIAGNOSIS — F0390 Unspecified dementia without behavioral disturbance: Secondary | ICD-10-CM | POA: Insufficient documentation

## 2016-10-12 DIAGNOSIS — Z923 Personal history of irradiation: Secondary | ICD-10-CM | POA: Insufficient documentation

## 2016-10-12 DIAGNOSIS — Z8673 Personal history of transient ischemic attack (TIA), and cerebral infarction without residual deficits: Secondary | ICD-10-CM | POA: Insufficient documentation

## 2016-10-12 DIAGNOSIS — D649 Anemia, unspecified: Secondary | ICD-10-CM | POA: Diagnosis not present

## 2016-10-12 DIAGNOSIS — R351 Nocturia: Secondary | ICD-10-CM | POA: Diagnosis not present

## 2016-10-12 DIAGNOSIS — R0602 Shortness of breath: Secondary | ICD-10-CM | POA: Insufficient documentation

## 2016-10-12 DIAGNOSIS — R079 Chest pain, unspecified: Secondary | ICD-10-CM | POA: Diagnosis not present

## 2016-10-12 DIAGNOSIS — J479 Bronchiectasis, uncomplicated: Secondary | ICD-10-CM | POA: Diagnosis not present

## 2016-10-12 DIAGNOSIS — R410 Disorientation, unspecified: Secondary | ICD-10-CM | POA: Diagnosis not present

## 2016-10-12 DIAGNOSIS — I10 Essential (primary) hypertension: Secondary | ICD-10-CM | POA: Diagnosis not present

## 2016-10-12 DIAGNOSIS — Z7901 Long term (current) use of anticoagulants: Secondary | ICD-10-CM | POA: Insufficient documentation

## 2016-10-12 DIAGNOSIS — J90 Pleural effusion, not elsewhere classified: Secondary | ICD-10-CM | POA: Insufficient documentation

## 2016-10-12 DIAGNOSIS — Z853 Personal history of malignant neoplasm of breast: Secondary | ICD-10-CM | POA: Diagnosis not present

## 2016-10-12 DIAGNOSIS — I700 Atherosclerosis of aorta: Secondary | ICD-10-CM | POA: Diagnosis not present

## 2016-10-12 DIAGNOSIS — R05 Cough: Secondary | ICD-10-CM | POA: Diagnosis not present

## 2016-10-12 MED ORDER — IRON SUCROSE 20 MG/ML IV SOLN: 200.0000 mg | Freq: Once | INTRAVENOUS | Status: DC

## 2016-10-12 MED ORDER — IRON SUCROSE 20 MG/ML IV SOLN
200.0000 mg | Freq: Once | INTRAVENOUS | Status: AC
  Administered 2016-10-12: 200 mg via INTRAVENOUS
  Filled 2016-10-12: qty 10

## 2016-10-12 MED ORDER — PREDNISONE 20 MG PO TABS: ORAL_TABLET | ORAL | 0 refills | Status: DC

## 2016-10-12 MED ORDER — SODIUM CHLORIDE 0.9 % IV SOLN
Freq: Once | INTRAVENOUS | Status: AC
  Administered 2016-10-12: 10:00:00 via INTRAVENOUS
  Filled 2016-10-12: qty 1000

## 2016-10-12 NOTE — Assessment & Plan Note (Addendum)
#   Squamous cell carcinoma stage I; poor surgical candidate. S/p RT [oct 2017]. See discussion below  # left chest wall pain/ cough/ dyspnea- ? CT revealed small left pleural effusion and left lower lobe changes from radiation vs residual tumor?  Most likely radiation pneumonitis.  Patient on Eliquis.  Pain/cough much improved after antibiotic course and prednisone.  Taper prednisone '40mg'$ /day for 2 weeks, then '20mg'$ /day for 4 weeks, continue until next appt in 6 weeks. Continue Albuterol inhaler as needed. Repeat CT scan prior to next visit in 6 weeks.  # Anemia microcytic anemia of chronic disease versus iron deficiency. Iron level on 09/19/16 was 16, ferritin 13.  Administer IV iron today, and take OTC iron daily. Recheck labs (CBC, BMP) in 6 weeks, possible IV iron.   % Chronic hypertension: stable; being followed by cardiologist  % Insomnia/Nocturia: Reiterated discussion of not eating or drinking 2 hours prior to bedtime or at night while in bed.   # Follow up in 6 weeks. [[phone]/ Melissa Palmer, daughter]. Plan of care was discussed with the patient's daughter in detail.

## 2016-10-12 NOTE — Progress Notes (Addendum)
Manual blood pressure taken 190/70. Patient and patient's daughter reports episodes of intermittent confusion and irritability. Daughter states "i believe the steroid use is causing this irritability and mood chgs." patient reports productive cough and mild dysnpea. Mucus production colors from green to brown

## 2016-10-12 NOTE — Progress Notes (Signed)
[facility] NOTE  Patient Care Team: Denita Lung, DO as PCP - General (Internal Medicine)  CHIEF COMPLAINTS/PURPOSE OF CONSULTATION:  Oncology History   # left lower lobe mass, which measures 4.3 x 3.1 cm (4:43). 1.4 cm right lower lobe peripheral nodular opacity is unchanged (4:40  # 1998 Breast cancer- s/p Stage I lumpec  & RT. No chemo; no pills     Primary cancer of left lower lobe of lung ([place])   04/13/2016 Initial Diagnosis    Primary cancer of left lower lobe of lung ([place])       #  Oncology History   # left lower lobe mass, which measures 4.3 x 3.1 cm (4:43). 1.4 cm right lower lobe peripheral nodular opacity is unchanged (4:40  # 1998 Breast cancer- s/p Stage I lumpec  & RT. No chemo; no pills     Primary cancer of left lower lobe of lung ([place])   04/13/2016 Initial Diagnosis    Primary cancer of left lower lobe of lung ([place])       HISTORY OF PRESENTING ILLNESS:  Melissa Palmer 83 y.o.  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  Patient states that chest pain, cough, and shortness of breath have improved significantly since completing 2 courses of antibiotics and continuing on prednisone '60mg'$  daily.  Reports occasional white, Navjot Loera, and green phlegm; no hemoptysis. Appetite is much improved. Daughter reports increasing confusion and irritability of patient since starting prednisone.  Results of patient's CT scan on 09/26/16 are noted below.  ROS: A complete 10 point review of system is done which is negative except mentioned above in history of present illness  MEDICAL HISTORY:  Past Medical History:  Diagnosis Date  . Cancer ([place])   . Cataract   . Dementia   . Hypertension   . TIA (transient ischemic attack)     SURGICAL HISTORY: Past Surgical History:  Procedure Laterality Date  . BREAST  SURGERY    . COLONOSCOPY    . HYSTEROTOMY    . UPPER GI ENDOSCOPY      SOCIAL HISTORY: Social History   Social History  . Marital status: Married    Spouse name: N/A  . Number of children: N/A  . Years of education: N/A   Occupational History  . Not on file.   Social History Main Topics  . Smoking status: Former Research scientist (life sciences)  . Smokeless tobacco: Never Used  . Alcohol use No  . Drug use: No  . Sexual activity: Not on file   Other Topics Concern  . Not on file   Social History Narrative  . No narrative on file    FAMILY HISTORY: Heart disease in family. No family history on file.  ALLERGIES:  is allergic to asa [aspirin]; flagyl [metronidazole]; hydrochlorothiazide; and sulfa antibiotics.  MEDICATIONS:  Current Outpatient Prescriptions  Medication Sig Dispense Refill  . albuterol (PROVENTIL HFA;VENTOLIN HFA) 108 (90 Base) MCG/ACT inhaler Inhale 2 puffs into the lungs every 6 (six) hours as needed for wheezing or shortness of breath. 1 Inhaler 2  . ALPRAZolam (XANAX) 0.5 MG tablet Take 0.5-1 mg by mouth 2 (two) times daily. Take 0.'25mg'$  in morning and take 0.'5mg'$  at night    . apixaban (ELIQUIS) 2.5 MG TABS tablet Take 1 tablet (2.5 mg total) by mouth 2 (two) times daily. 60 tablet  0  . cloNIDine (CATAPRES) 0.1 MG tablet Take 0.1 mg by mouth at bedtime.    Marland Kitchen diltiazem (CARDIZEM CD) 360 MG 24 hr capsule Take 360 mg by mouth daily.    . fluvoxaMINE (LUVOX) 50 MG tablet Take 25 mg by mouth daily at 12 noon.    . loratadine (CLARITIN) 10 MG tablet Take 10 mg by mouth daily.    . pantoprazole (PROTONIX) 20 MG tablet Take 20 mg by mouth daily.    . predniSONE (DELTASONE) 20 MG tablet Take 2 pills once a day by mouth for 2 weeks; then take 1 pill once a day by mouth for 4 weeks; with food; in AM  Continue taking until next appt with Dr. B. 60 tablet 0   No current facility-administered medications for this visit.    Facility-Administered Medications Ordered in Other Visits   Medication Dose Route Frequency Provider Last Rate Last Dose  . 0.9 %  sodium chloride infusion   Intravenous Once Cammie Sickle, MD          .  PHYSICAL EXAMINATION: ECOG PERFORMANCE STATUS: 1 - Symptomatic but completely ambulatory  Vitals:   10/12/16 0852 10/12/16 0859  BP: (!) 200/77 (!) 190/70  Pulse: 76   Resp: 18   Temp: 97.6 F (36.4 C)    Filed Weights   10/12/16 0852  Weight: 136 lb 6.4 oz (61.9 kg)    GENERAL: Well-nourished well-developed; Alert, no distress and comfortable.    EYAccompanied by daughter.ES: no pallor or icterus OROPHARYNX: no thrush or ulceration;   NECK: supple, no masses felt LYMPH:  no palpable lymphadenopathy in the cervical, axillary or inguinal regions LUNGS: decreased breath sounds to auscultation and  No wheeze or crackles HEART/CVS: regular rate & rhythm and no murmurs; No lower extremity edema ABDOMEN: abdomen soft, non-tender and normal bowel sounds Musculoskeletal:no cyanosis of digits and no clubbing  PSYCH: alert & oriented x 3 with fluent speech NEURO: no focal motor/sensory deficits SKIN:  no rashes or significant lesions    RADIOGRAPHIC STUDIES: I have personally reviewed the radiological images as listed and agreed with the findings in the report. Dg Chest 2 View  Result Date: 09/19/2016 CLINICAL DATA:  Left-sided chest pain with fever and cough. History right lung cancer with radiation therapy. EXAM: CHEST  2 VIEW COMPARISON:  Radiographs dated 04/24/2016 and 03/07/2016 FINDINGS: There is a 5.2 cm masslike density at the left lung base in an area of previous pulmonary parenchymal scarring. There scarring at the right lung base is well. Multiple surgical clips in the right mid lung zone. Heart size and pulmonary vascularity are normal. Calcification in the arch of the aorta. Stable bilateral apical pleural thickening, right more than left. Previous resection of the right fifth rib. IMPRESSION: Masslike density at the  left lung base posteriorly. This could represent pneumonia but given the patient's history, the possibility of malignancy should be considered. Chronic interstitial and obstructive lung disease. Aortic atherosclerosis. Electronically Signed   By: Lorriane Shire M.D.   On: 09/19/2016 10:18   Ct Chest W Contrast  Result Date: 09/26/2016 CLINICAL DATA:  Followup lung cancer.  Status post radiation. EXAM: CT CHEST WITH CONTRAST TECHNIQUE: Multidetector CT imaging of the chest was performed during intravenous contrast administration. CONTRAST:  13m ISOVUE-300 IOPAMIDOL (ISOVUE-300) INJECTION 61% COMPARISON:  None. FINDINGS: Cardiovascular: The heart size appears mildly enlarged. There is aortic atherosclerosis. Calcifications in the LAD, left circumflex and RCA coronary arteries. Mediastinum/Nodes: There is no axillary or  supraclavicular adenopathy. Lungs/Pleura: There are moderate changes of centrilobular emphysema. Changes of interstitial lung disease are identified with basilar and peripheral honeycombing and traction bronchiectasis. There is a small to moderate left pleural effusion identified. Mass containing fiducial markers within the left lower lobe measures 3.4 x 5.0 by 3.6 cm. Surrounding changes of external beam radiation identified including airspace consolidation and fibrosis. Nodular density within the right lower lobe is identified measuring 1.6 cm, image 107 of series 3. Nonspecific. Upper Abdomen: No acute abnormality. Musculoskeletal: No chest wall abnormality. No acute or significant osseous findings. IMPRESSION: 1. Left lower lobe lung mass containing fiducial markers identified as above. Findings suspicious for residual tumor. If previous examinations are available for side-by-side comparison this may be helpful to assess for interval response to therapy. 2. There are changes of external beam radiation within the left lower lobe surrounding the left lung mass. 3. Small left pleural effusion 4.  Emphysema 5. Suspect chronic interstitial lung disease with lower lobe predominant interstitial honeycombing and traction bronchiectasis 6. Aortic atherosclerosis and multi vessel coronary artery calcification. Electronically Signed   By: Kerby Moors M.D.   On: 09/26/2016 11:20    ASSESSMENT & PLAN:   Primary cancer of left lower lobe of lung ([place]) # Squamous cell carcinoma stage I; poor surgical candidate. S/p RT [oct 2017]. See discussion below  # left chest wall pain/ cough/ dyspnea- ? CT revealed small left pleural effusion and left lower lobe changes from radiation vs residual tumor?  Most likely radiation pneumonitis.  Patient on Eliquis.  Pain/cough much improved after antibiotic course and prednisone.  Taper prednisone '40mg'$ /day for 2 weeks, then '20mg'$ /day for 4 weeks, continue until next appt in 6 weeks. Continue Albuterol inhaler as needed. Repeat CT scan prior to next visit in 6 weeks.  # Anemia microcytic anemia of chronic disease versus iron deficiency. Iron level on 09/19/16 was 16, ferritin 13.  Administer IV iron today, and take OTC iron daily. Recheck labs (CBC, BMP) in 6 weeks, possible IV iron.   % Chronic hypertension: stable; being followed by cardiologist  % Insomnia/Nocturia: Reiterated discussion of not eating or drinking 2 hours prior to bedtime or at night while in bed.   # Follow up in 6 weeks. [[phone]/ Langley Gauss, daughter]. Plan of care was discussed with the patient's daughter in detail.   Lucendia Herrlich, NP 10/12/2016 11:07 AM

## 2016-10-14 IMAGING — CR DG CHEST 2V
1 series · 2 of 2 positions shown · non-contrast
Comparison: 03/07/2016. There are no other previous examinations
available for comparison at this time.

CLINICAL DATA: Resident shortness of breath since starting
radiation therapy for a left lower lobe lung cancer.

EXAM:
CHEST  2 VIEW

[Series 1: dg chest 2 view · 0.14mm/px · 2 of 2 slices shown]
[im 1/2]
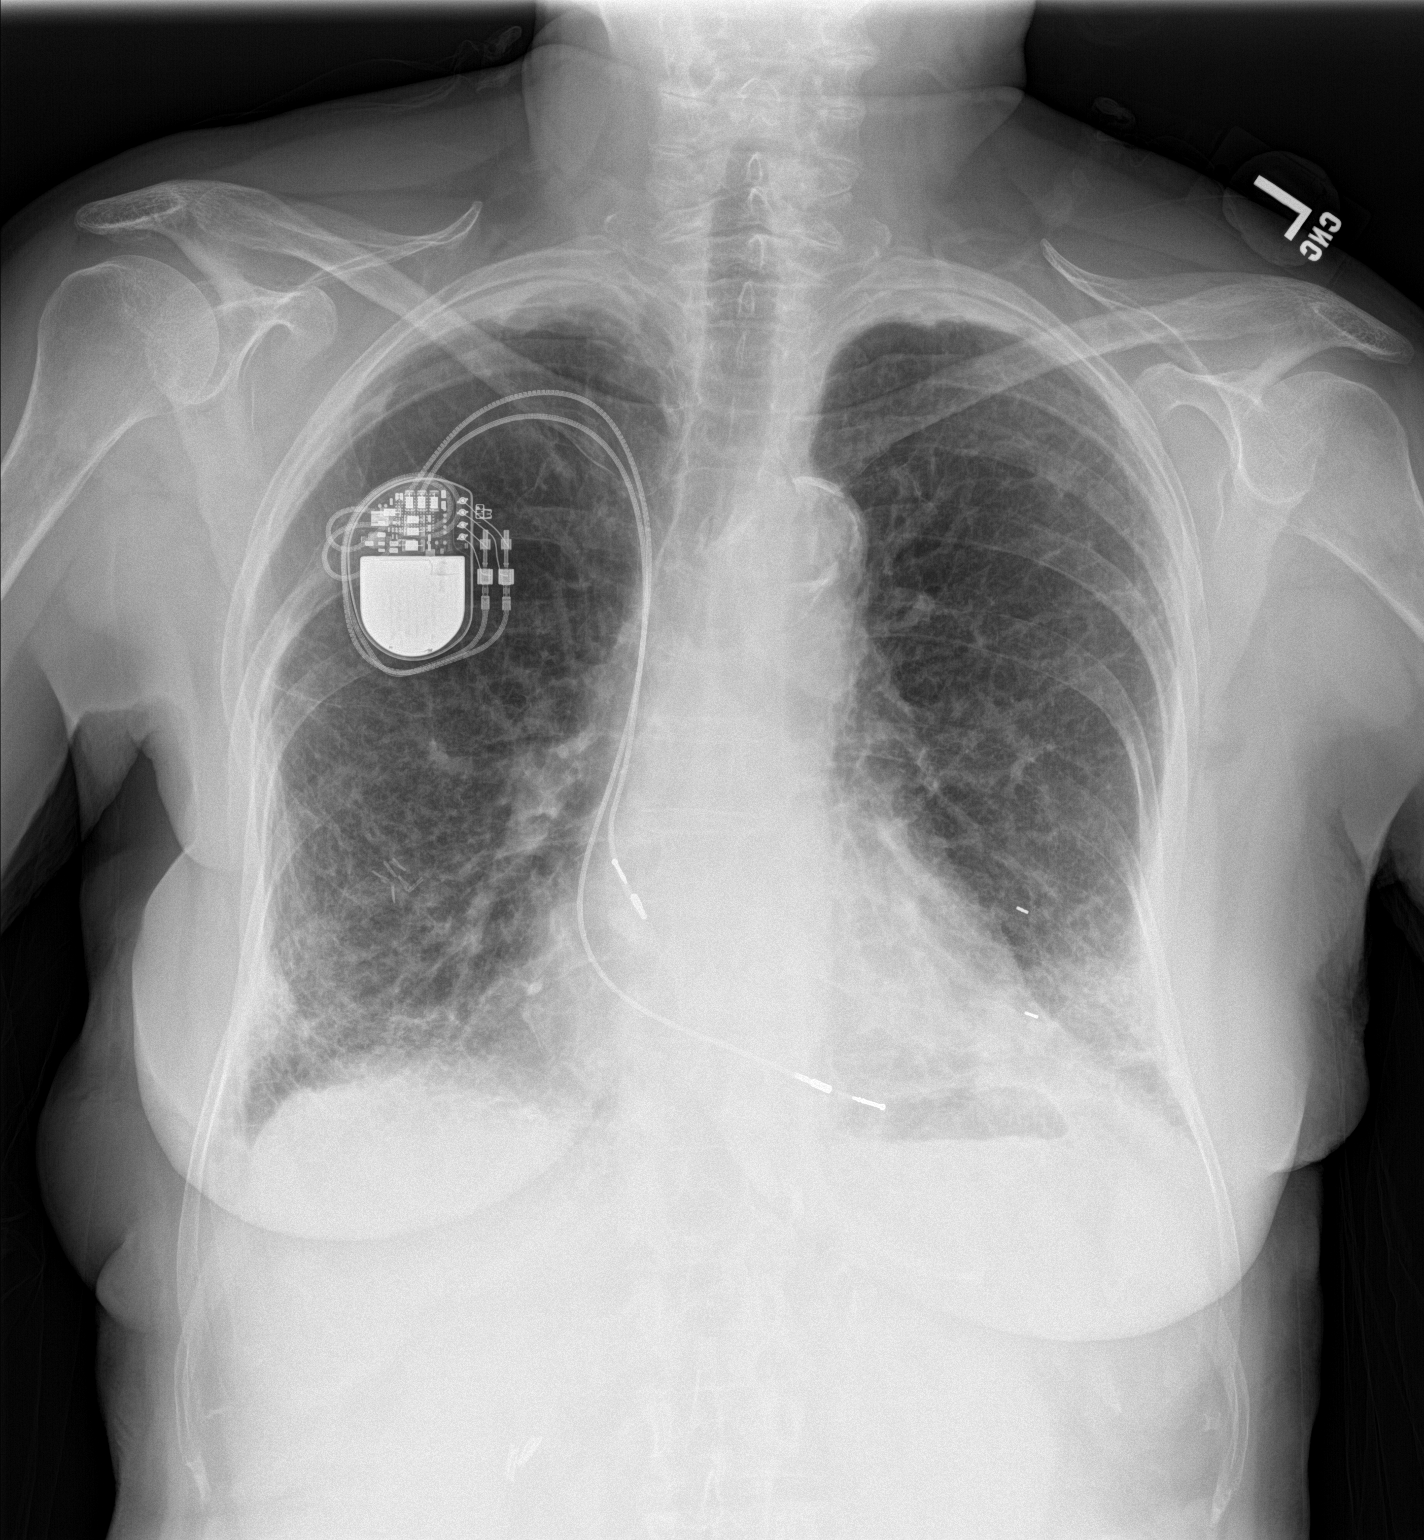
[im 2/2]
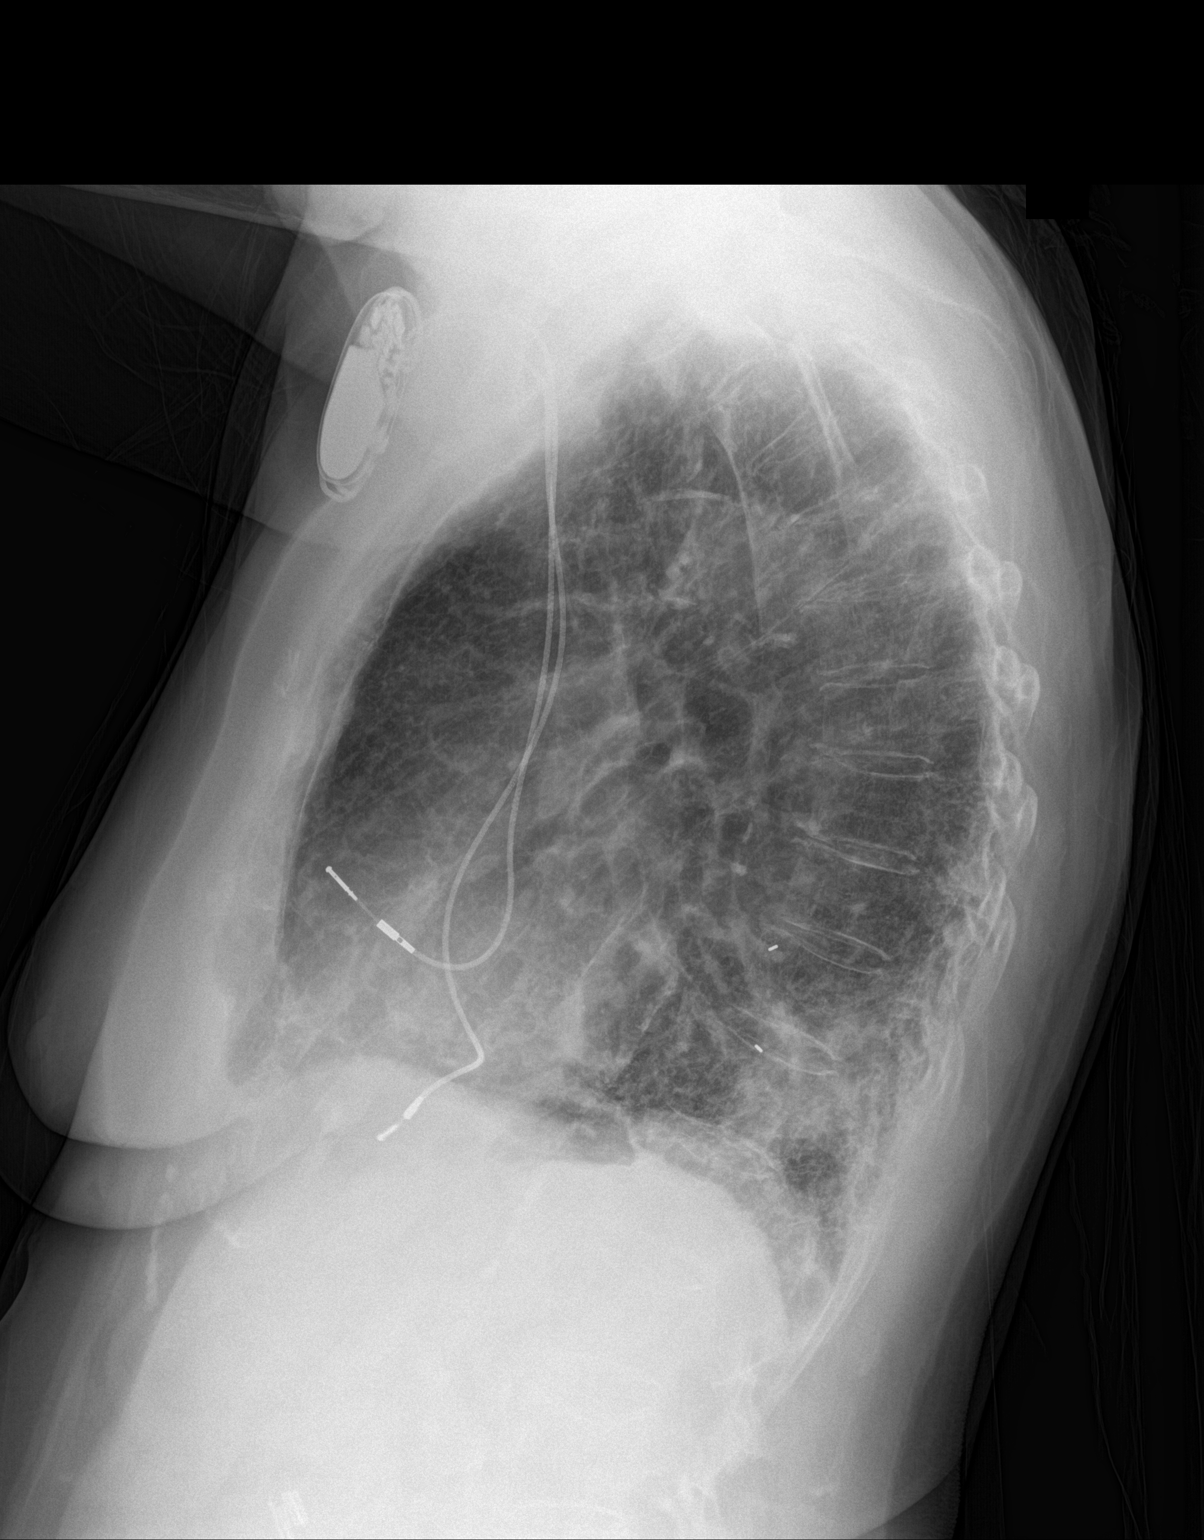

[2 of 2 positions shown; findings below may reference images not displayed]

FINDINGS: Normal sized heart. Prominent pulmonary vasculature and interstitial
markings are re-. Two left lower lobe radiation seed implants.
Poorly defined rounded density in the posterior, lateral aspect of
the left lower lobe. Right upper breast surgical clips. Left
subclavian pacemaker leads in satisfactory position. Aortic
calcifications. Diffuse osteopenia.
IMPRESSION: 1. Ill-defined rounded density in the left lower lobe. This is most
likely the patient's known lung cancer. Rounded pneumonia could also
have this appearance.
2. Chronic interstitial lung disease.
3. Aortic atherosclerosis.

## 2016-10-17 ENCOUNTER — Ambulatory Visit: Payer: Medicare HMO

## 2016-10-23 ENCOUNTER — Ambulatory Visit: Payer: Medicare HMO | Admitting: Radiation Oncology

## 2016-11-01 ENCOUNTER — Telehealth: Payer: Self-pay | Admitting: *Deleted

## 2016-11-01 NOTE — Telephone Encounter (Signed)
Per Dr Rogue Bussing, okay to stop prednisone.  Advised pt's daughter, Langley Gauss.

## 2016-11-01 NOTE — Telephone Encounter (Signed)
Pt having increased confusion and unable to carry on conversation. Pt's daughter, Hillary Bow, thinks it may be related to her taking prednisone. Pt down to 1 tablet now and wants to know if can discontinue prednisone today instead of waiting until her next appt. Please advise.

## 2016-11-06 ENCOUNTER — Emergency Department: Payer: Medicare HMO

## 2016-11-06 ENCOUNTER — Encounter: Payer: Self-pay | Admitting: Emergency Medicine

## 2016-11-06 ENCOUNTER — Inpatient Hospital Stay
Admission: EM | Admit: 2016-11-06 | Discharge: 2016-12-12 | DRG: 871 | Disposition: E | Payer: Medicare HMO | Attending: Internal Medicine | Admitting: Internal Medicine

## 2016-11-06 DIAGNOSIS — J700 Acute pulmonary manifestations due to radiation: Secondary | ICD-10-CM | POA: Diagnosis present

## 2016-11-06 DIAGNOSIS — C3492 Malignant neoplasm of unspecified part of left bronchus or lung: Secondary | ICD-10-CM

## 2016-11-06 DIAGNOSIS — R079 Chest pain, unspecified: Secondary | ICD-10-CM | POA: Diagnosis not present

## 2016-11-06 DIAGNOSIS — Y842 Radiological procedure and radiotherapy as the cause of abnormal reaction of the patient, or of later complication, without mention of misadventure at the time of the procedure: Secondary | ICD-10-CM | POA: Diagnosis present

## 2016-11-06 DIAGNOSIS — Z7901 Long term (current) use of anticoagulants: Secondary | ICD-10-CM

## 2016-11-06 DIAGNOSIS — R5383 Other fatigue: Secondary | ICD-10-CM | POA: Diagnosis not present

## 2016-11-06 DIAGNOSIS — R0600 Dyspnea, unspecified: Secondary | ICD-10-CM | POA: Diagnosis not present

## 2016-11-06 DIAGNOSIS — J9601 Acute respiratory failure with hypoxia: Secondary | ICD-10-CM | POA: Diagnosis present

## 2016-11-06 DIAGNOSIS — N179 Acute kidney failure, unspecified: Secondary | ICD-10-CM | POA: Diagnosis present

## 2016-11-06 DIAGNOSIS — N183 Chronic kidney disease, stage 3 (moderate): Secondary | ICD-10-CM | POA: Diagnosis present

## 2016-11-06 DIAGNOSIS — A419 Sepsis, unspecified organism: Principal | ICD-10-CM | POA: Diagnosis present

## 2016-11-06 DIAGNOSIS — I272 Pulmonary hypertension, unspecified: Secondary | ICD-10-CM | POA: Diagnosis present

## 2016-11-06 DIAGNOSIS — Z8673 Personal history of transient ischemic attack (TIA), and cerebral infarction without residual deficits: Secondary | ICD-10-CM

## 2016-11-06 DIAGNOSIS — F0390 Unspecified dementia without behavioral disturbance: Secondary | ICD-10-CM | POA: Diagnosis present

## 2016-11-06 DIAGNOSIS — J9600 Acute respiratory failure, unspecified whether with hypoxia or hypercapnia: Secondary | ICD-10-CM | POA: Diagnosis not present

## 2016-11-06 DIAGNOSIS — J984 Other disorders of lung: Secondary | ICD-10-CM | POA: Diagnosis present

## 2016-11-06 DIAGNOSIS — Z886 Allergy status to analgesic agent status: Secondary | ICD-10-CM

## 2016-11-06 DIAGNOSIS — E876 Hypokalemia: Secondary | ICD-10-CM | POA: Diagnosis present

## 2016-11-06 DIAGNOSIS — R0602 Shortness of breath: Secondary | ICD-10-CM | POA: Diagnosis not present

## 2016-11-06 DIAGNOSIS — Z881 Allergy status to other antibiotic agents status: Secondary | ICD-10-CM

## 2016-11-06 DIAGNOSIS — K219 Gastro-esophageal reflux disease without esophagitis: Secondary | ICD-10-CM

## 2016-11-06 DIAGNOSIS — Z882 Allergy status to sulfonamides status: Secondary | ICD-10-CM

## 2016-11-06 DIAGNOSIS — I4891 Unspecified atrial fibrillation: Secondary | ICD-10-CM | POA: Diagnosis present

## 2016-11-06 DIAGNOSIS — Z950 Presence of cardiac pacemaker: Secondary | ICD-10-CM | POA: Diagnosis not present

## 2016-11-06 DIAGNOSIS — Z923 Personal history of irradiation: Secondary | ICD-10-CM

## 2016-11-06 DIAGNOSIS — R0902 Hypoxemia: Secondary | ICD-10-CM

## 2016-11-06 DIAGNOSIS — J8410 Pulmonary fibrosis, unspecified: Secondary | ICD-10-CM | POA: Diagnosis present

## 2016-11-06 DIAGNOSIS — Z66 Do not resuscitate: Secondary | ICD-10-CM | POA: Diagnosis present

## 2016-11-06 DIAGNOSIS — R531 Weakness: Secondary | ICD-10-CM

## 2016-11-06 DIAGNOSIS — Z79899 Other long term (current) drug therapy: Secondary | ICD-10-CM

## 2016-11-06 DIAGNOSIS — Z515 Encounter for palliative care: Secondary | ICD-10-CM

## 2016-11-06 DIAGNOSIS — I5031 Acute diastolic (congestive) heart failure: Secondary | ICD-10-CM | POA: Diagnosis not present

## 2016-11-06 DIAGNOSIS — D638 Anemia in other chronic diseases classified elsewhere: Secondary | ICD-10-CM | POA: Diagnosis present

## 2016-11-06 DIAGNOSIS — J189 Pneumonia, unspecified organism: Secondary | ICD-10-CM

## 2016-11-06 DIAGNOSIS — N390 Urinary tract infection, site not specified: Secondary | ICD-10-CM | POA: Diagnosis present

## 2016-11-06 DIAGNOSIS — I129 Hypertensive chronic kidney disease with stage 1 through stage 4 chronic kidney disease, or unspecified chronic kidney disease: Secondary | ICD-10-CM | POA: Diagnosis present

## 2016-11-06 DIAGNOSIS — R4182 Altered mental status, unspecified: Secondary | ICD-10-CM | POA: Diagnosis not present

## 2016-11-06 DIAGNOSIS — Z888 Allergy status to other drugs, medicaments and biological substances status: Secondary | ICD-10-CM

## 2016-11-06 DIAGNOSIS — J9611 Chronic respiratory failure with hypoxia: Secondary | ICD-10-CM | POA: Diagnosis not present

## 2016-11-06 DIAGNOSIS — Z7189 Other specified counseling: Secondary | ICD-10-CM

## 2016-11-06 DIAGNOSIS — J90 Pleural effusion, not elsewhere classified: Secondary | ICD-10-CM | POA: Diagnosis not present

## 2016-11-06 DIAGNOSIS — G893 Neoplasm related pain (acute) (chronic): Secondary | ICD-10-CM | POA: Diagnosis present

## 2016-11-06 DIAGNOSIS — Y95 Nosocomial condition: Secondary | ICD-10-CM | POA: Diagnosis present

## 2016-11-06 DIAGNOSIS — D649 Anemia, unspecified: Secondary | ICD-10-CM | POA: Diagnosis not present

## 2016-11-06 DIAGNOSIS — Z87891 Personal history of nicotine dependence: Secondary | ICD-10-CM | POA: Diagnosis not present

## 2016-11-06 DIAGNOSIS — F419 Anxiety disorder, unspecified: Secondary | ICD-10-CM | POA: Diagnosis present

## 2016-11-06 DIAGNOSIS — N189 Chronic kidney disease, unspecified: Secondary | ICD-10-CM | POA: Diagnosis not present

## 2016-11-06 DIAGNOSIS — Z9981 Dependence on supplemental oxygen: Secondary | ICD-10-CM | POA: Diagnosis not present

## 2016-11-06 DIAGNOSIS — I10 Essential (primary) hypertension: Secondary | ICD-10-CM | POA: Diagnosis not present

## 2016-11-06 DIAGNOSIS — R112 Nausea with vomiting, unspecified: Secondary | ICD-10-CM | POA: Diagnosis not present

## 2016-11-06 DIAGNOSIS — C3432 Malignant neoplasm of lower lobe, left bronchus or lung: Secondary | ICD-10-CM | POA: Diagnosis present

## 2016-11-06 DIAGNOSIS — D509 Iron deficiency anemia, unspecified: Secondary | ICD-10-CM | POA: Diagnosis not present

## 2016-11-06 DIAGNOSIS — R630 Anorexia: Secondary | ICD-10-CM | POA: Diagnosis not present

## 2016-11-06 HISTORY — DX: Unspecified atrial fibrillation: I48.91

## 2016-11-06 LAB — URINALYSIS, COMPLETE (UACMP) WITH MICROSCOPIC
Bilirubin Urine: NEGATIVE
Glucose, UA: NEGATIVE mg/dL
KETONES UR: NEGATIVE mg/dL
Nitrite: POSITIVE — AB
PROTEIN: NEGATIVE mg/dL
Specific Gravity, Urine: 1.015 (ref 1.005–1.030)
pH: >9 — ABNORMAL HIGH (ref 5.0–8.0)

## 2016-11-06 LAB — CBC
HCT: 34.5 % — ABNORMAL LOW (ref 35.0–47.0)
Hemoglobin: 11.2 g/dL — ABNORMAL LOW (ref 12.0–16.0)
MCH: 24.2 pg — AB (ref 26.0–34.0)
MCHC: 32.5 g/dL (ref 32.0–36.0)
MCV: 74.4 fL — AB (ref 80.0–100.0)
PLATELETS: 283 10*3/uL (ref 150–440)
RBC: 4.64 MIL/uL (ref 3.80–5.20)
RDW: 25.3 % — ABNORMAL HIGH (ref 11.5–14.5)
WBC: 7 10*3/uL (ref 3.6–11.0)

## 2016-11-06 LAB — COMPREHENSIVE METABOLIC PANEL
ALBUMIN: 2.7 g/dL — AB (ref 3.5–5.0)
ALK PHOS: 42 U/L (ref 38–126)
ALT: 26 U/L (ref 14–54)
AST: 19 U/L (ref 15–41)
Anion gap: 7 (ref 5–15)
BUN: 16 mg/dL (ref 6–20)
CHLORIDE: 103 mmol/L (ref 101–111)
CO2: 26 mmol/L (ref 22–32)
CREATININE: 1.36 mg/dL — AB (ref 0.44–1.00)
Calcium: 8.8 mg/dL — ABNORMAL LOW (ref 8.9–10.3)
GFR calc non Af Amer: 34 mL/min — ABNORMAL LOW (ref >60)
GFR, EST AFRICAN AMERICAN: 40 mL/min — AB (ref >60)
GLUCOSE: 110 mg/dL — AB (ref 65–99)
Potassium: 3.7 mmol/L (ref 3.5–5.1)
SODIUM: 136 mmol/L (ref 135–145)
Total Bilirubin: 0.6 mg/dL (ref 0.3–1.2)
Total Protein: 5.9 g/dL — ABNORMAL LOW (ref 6.5–8.1)

## 2016-11-06 LAB — TROPONIN I: Troponin I: <0.03 ng/mL (ref <0.03)

## 2016-11-06 LAB — STREP PNEUMONIAE URINARY ANTIGEN: STREP PNEUMO URINARY ANTIGEN: NEGATIVE

## 2016-11-06 MED ORDER — CLONIDINE HCL 0.1 MG PO TABS
0.1000 mg | ORAL_TABLET | Freq: Every day | ORAL | Status: DC
  Administered 2016-11-06 – 2016-11-10 (×5): 0.1 mg via ORAL
  Filled 2016-11-06 (×5): qty 1

## 2016-11-06 MED ORDER — LORATADINE 10 MG PO TABS
10.0000 mg | ORAL_TABLET | Freq: Every day | ORAL | Status: DC
  Administered 2016-11-07 – 2016-11-13 (×6): 10 mg via ORAL
  Filled 2016-11-06 (×7): qty 1

## 2016-11-06 MED ORDER — DOCUSATE SODIUM 100 MG PO CAPS
100.0000 mg | ORAL_CAPSULE | Freq: Two times a day (BID) | ORAL | Status: DC
  Administered 2016-11-06 – 2016-11-13 (×14): 100 mg via ORAL
  Filled 2016-11-06 (×15): qty 1

## 2016-11-06 MED ORDER — ALPRAZOLAM 0.5 MG PO TABS
0.5000 mg | ORAL_TABLET | Freq: Every day | ORAL | Status: DC
  Administered 2016-11-06: 22:00:00 0.5 mg via ORAL
  Filled 2016-11-06: qty 1

## 2016-11-06 MED ORDER — ALPRAZOLAM 0.5 MG PO TABS
0.2500 mg | ORAL_TABLET | Freq: Every morning | ORAL | Status: DC
  Administered 2016-11-06 – 2016-11-07 (×2): 0.25 mg via ORAL
  Filled 2016-11-06 (×3): qty 1

## 2016-11-06 MED ORDER — ACETAMINOPHEN 325 MG PO TABS
650.0000 mg | ORAL_TABLET | Freq: Four times a day (QID) | ORAL | Status: DC | PRN
  Administered 2016-11-06 – 2016-11-07 (×2): 650 mg via ORAL
  Filled 2016-11-06 (×4): qty 2

## 2016-11-06 MED ORDER — ALBUTEROL SULFATE (2.5 MG/3ML) 0.083% IN NEBU: 2.5000 mg | INHALATION_SOLUTION | Freq: Four times a day (QID) | RESPIRATORY_TRACT | Status: DC | PRN

## 2016-11-06 MED ORDER — ALBUTEROL SULFATE HFA 108 (90 BASE) MCG/ACT IN AERS: 2.0000 | INHALATION_SPRAY | Freq: Four times a day (QID) | RESPIRATORY_TRACT | Status: DC | PRN

## 2016-11-06 MED ORDER — GUAIFENESIN 100 MG/5ML PO SOLN
5.0000 mL | ORAL | Status: DC | PRN
  Filled 2016-11-06: qty 5

## 2016-11-06 MED ORDER — TRAMADOL HCL 50 MG PO TABS
50.0000 mg | ORAL_TABLET | Freq: Once | ORAL | Status: AC
  Administered 2016-11-06: 50 mg via ORAL
  Filled 2016-11-06: qty 1

## 2016-11-06 MED ORDER — LEVOFLOXACIN IN D5W 750 MG/150ML IV SOLN
750.0000 mg | Freq: Once | INTRAVENOUS | Status: AC
  Administered 2016-11-06: 750 mg via INTRAVENOUS
  Filled 2016-11-06: qty 150

## 2016-11-06 MED ORDER — LEVOFLOXACIN IN D5W 750 MG/150ML IV SOLN
750.0000 mg | INTRAVENOUS | Status: DC
  Filled 2016-11-06: qty 150

## 2016-11-06 MED ORDER — SODIUM CHLORIDE 0.9 % IV SOLN
Freq: Once | INTRAVENOUS | Status: AC
Start: 2016-11-06 — End: 2016-11-06
  Administered 2016-11-06: 08:00:00 via INTRAVENOUS

## 2016-11-06 MED ORDER — DILTIAZEM HCL ER COATED BEADS 180 MG PO CP24
360.0000 mg | ORAL_CAPSULE | Freq: Every day | ORAL | Status: DC
  Administered 2016-11-07 – 2016-11-13 (×6): 360 mg via ORAL
  Filled 2016-11-06 (×8): qty 1

## 2016-11-06 MED ORDER — PANTOPRAZOLE SODIUM 40 MG PO TBEC
40.0000 mg | DELAYED_RELEASE_TABLET | Freq: Every day | ORAL | Status: DC
  Administered 2016-11-07 – 2016-11-13 (×6): 40 mg via ORAL
  Filled 2016-11-06 (×7): qty 1

## 2016-11-06 MED ORDER — FLUVOXAMINE MALEATE 50 MG PO TABS
25.0000 mg | ORAL_TABLET | Freq: Every day | ORAL | Status: DC
  Administered 2016-11-07 – 2016-11-13 (×5): 25 mg via ORAL
  Filled 2016-11-06 (×6): qty 1

## 2016-11-06 MED ORDER — SENNOSIDES-DOCUSATE SODIUM 8.6-50 MG PO TABS: 1.0000 | ORAL_TABLET | Freq: Every evening | ORAL | Status: DC | PRN

## 2016-11-06 MED ORDER — IPRATROPIUM-ALBUTEROL 0.5-2.5 (3) MG/3ML IN SOLN
3.0000 mL | RESPIRATORY_TRACT | Status: DC | PRN
  Administered 2016-11-08: 04:00:00 3 mL via RESPIRATORY_TRACT
  Filled 2016-11-06: qty 3

## 2016-11-06 MED ORDER — SODIUM CHLORIDE 0.9 % IV SOLN
INTRAVENOUS | Status: DC
  Administered 2016-11-06: 17:00:00 via INTRAVENOUS

## 2016-11-06 MED ORDER — ONDANSETRON HCL 4 MG/2ML IJ SOLN
4.0000 mg | Freq: Four times a day (QID) | INTRAMUSCULAR | Status: DC | PRN
  Administered 2016-11-11 – 2016-11-14 (×3): 4 mg via INTRAVENOUS
  Filled 2016-11-06 (×3): qty 2

## 2016-11-06 MED ORDER — IOPAMIDOL (ISOVUE-370) INJECTION 76%
60.0000 mL | Freq: Once | INTRAVENOUS | Status: AC | PRN
  Administered 2016-11-06: 60 mL via INTRAVENOUS

## 2016-11-06 MED ORDER — APIXABAN 2.5 MG PO TABS
2.5000 mg | ORAL_TABLET | Freq: Two times a day (BID) | ORAL | Status: DC
  Administered 2016-11-06 – 2016-11-13 (×13): 2.5 mg via ORAL
  Filled 2016-11-06 (×14): qty 1

## 2016-11-06 MED ORDER — ALPRAZOLAM 0.5 MG PO TABS: 0.5000 mg | ORAL_TABLET | Freq: Two times a day (BID) | ORAL | Status: DC

## 2016-11-06 MED ORDER — LABETALOL HCL 5 MG/ML IV SOLN
10.0000 mg | INTRAVENOUS | Status: DC | PRN
  Administered 2016-11-07 – 2016-11-11 (×2): 10 mg via INTRAVENOUS
  Filled 2016-11-06 (×5): qty 4

## 2016-11-06 MED ORDER — KETOROLAC TROMETHAMINE 30 MG/ML IJ SOLN
15.0000 mg | Freq: Once | INTRAMUSCULAR | Status: AC
  Administered 2016-11-06: 15 mg via INTRAVENOUS
  Filled 2016-11-06: qty 1

## 2016-11-06 NOTE — Plan of Care (Signed)
Problem: Activity: Goal: Ability to tolerate increased activity will improve Outcome: Progressing Pt is 1 asisst to the BR

## 2016-11-06 NOTE — ED Notes (Signed)
Patient transported to CT 

## 2016-11-06 NOTE — ED Provider Notes (Signed)
[facility] Emergency Department Provider Note        Time seen: ----------------------------------------- 7:07 AM on 11/11/2016 -----------------------------------------    I have reviewed the triage vital signs and the nursing notes.   HISTORY   Chief Complaint Muscle Pain    HPI Melissa Palmer is a 83 y.o. female who presents to the ED for body pain due to radiation therapy for a spot on her lungs. Patient was seen at the doctor a  week ago and was told everything looked fine. Patient reports being scheduled for a staging CT scan one week from now. She has not had radiation since October and is not currently taking chemotherapy. She has a left upper lung mass. She denies fevers, chills does have shortness of breath particularly with any ambulation or activity. Pain is in the upper left chest and radiates into the left arm. She has multiple drug allergies and is afraid to take pain medicine.   Past Medical History:  Diagnosis Date  . Cancer ([place])   . Cataract   . Dementia   . Hypertension   . TIA (transient ischemic attack)     Patient Active Problem List   Diagnosis Date Noted  . Iron deficiency anemia due to chronic blood loss 09/20/2016  . Radiation pneumonitis ([place]) 09/19/2016  . Primary cancer of left lower lobe of lung ([place]) 04/13/2016  . Stroke (cerebrum) ([place]) 03/07/2016    Past Surgical History:  Procedure Laterality Date  . BREAST SURGERY    . COLONOSCOPY    . HYSTEROTOMY    . UPPER GI ENDOSCOPY      Allergies Asa [aspirin]; Flagyl [metronidazole]; Hydrochlorothiazide; and Sulfa antibiotics  Social History Social History  Substance Use Topics  . Smoking status: Former Research scientist (life sciences)  . Smokeless tobacco: Never Used  . Alcohol use No    Review of Systems Constitutional: Negative for fever. Cardiovascular:Positive for chest pain Respiratory: Positive for shortness of breath Gastrointestinal: Negative for abdominal pain,  vomiting and diarrhea. Musculoskeletal: Negative for back pain. Skin: Negative for rash. Neurological: Negative for headaches, focal weakness or numbness.  10-point ROS otherwise negative.  ____________________________________________   PHYSICAL EXAM:  VITAL SIGNS: ED Triage Vitals  Enc Vitals Group     BP 10/23/2016 0659 (!) 156/61     Pulse Rate 10/16/2016 0659 (!) 106     Resp 10/30/2016 0659 18     Temp 10/17/2016 0659 97.5 F (36.4 C)     Temp Source 10/25/2016 0659 Oral     SpO2 10/21/2016 0659 96 %     Weight 10/27/2016 0659 136 lb (61.7 kg)     Height 11/08/2016 0659 '5\' 4"'$  (1.626 m)     Head Circumference --      Peak Flow --      Pain Score 11/08/2016 0700 10     Pain Loc --      Pain Edu? --      Excl. in [place]? --     Constitutional: Alert and oriented. Well appearing and in no distress. Eyes: Conjunctivae are pale. PERRL. Normal extraocular movements. ENT   Head: Normocephalic and atraumatic.   Nose: No congestion/rhinnorhea.   Mouth/Throat: Mucous membranes are moist.   Neck: No stridor. Cardiovascular: Normal rate, regular rhythm. No murmurs, rubs, or gallops. Respiratory: Normal respiratory effort without tachypnea nor retractions. Breath sounds are clear and equal bilaterally. No wheezes/rales/rhonchi. Gastrointestinal: Soft and nontender. Normal bowel sounds Musculoskeletal: Nontender with normal range of motion in extremities. No lower extremity  tenderness nor edema. Neurologic:  Normal speech and language. No gross focal neurologic deficits are appreciated.  Skin:  Skin is warm, dry and intact. Small rash in the midline of her back. Psychiatric: Mood and affect are normal. Speech and behavior are normal.  ____________________________________________  EKG: Interpreted by me. Sinus rhythm with a rate of 94 bpm, normal PR interval, normal QRS, normal QT, normal axis.  ____________________________________________  ED COURSE:  Pertinent labs & imaging  results that were available during my care of the patient were reviewed by me and considered in my medical decision making (see chart for details). Patient presents for chest pain, we will assess with labs and likely proceed with CT imaging of the chest   Procedures ____________________________________________   LABS (pertinent positives/negatives)  Labs Reviewed  COMPREHENSIVE METABOLIC PANEL - Abnormal; Notable for the following:       Result Value   Glucose, Bld 110 (*)    Creatinine, Ser 1.36 (*)    Calcium 8.8 (*)    Total Protein 5.9 (*)    Albumin 2.7 (*)    GFR calc non Af Amer 34 (*)    GFR calc Af Amer 40 (*)    All other components within normal limits  CBC - Abnormal; Notable for the following:    Hemoglobin 11.2 (*)    HCT 34.5 (*)    MCV 74.4 (*)    MCH 24.2 (*)    RDW 25.3 (*)    All other components within normal limits  TROPONIN I    RADIOLOGY Images were viewed by me  CT chest IMPRESSION: 1. No evidence of pulmonary embolism. Minimal motion degradation. 2. Increase in left lower lobe consolidation with slight increase in small left pleural effusion. Increased consolidation could be due to evolving radiation change. Residual/recurrent disease cannot be excluded. 3. Developing adenopathy within the azygoesophageal recess station of the mediastinum. Suspicious for nodal metastasis. Reactive adenopathy could look similar. This could be re-evaluated with short term follow-up CT or further characterized with PET. 4. Coronary artery atherosclerosis. Aortic atherosclerosis. 5. 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  CT head is unremarkable ____________________________________________  FINAL ASSESSMENT AND PLAN  Chest pain, shortness of breath, weakness  Plan: Patient's labs and imaging were dictated above. Patient had presented for left-sided chest pain and difficulty breathing with weakness. I have discussed with her  oncologist recommends hospital admission with possible pulmonology consultation. I ordered IV Levaquin to treat for pneumonia. She is stable for admission at this time.   Earleen Newport, MD   Note: This note was generated in part or whole with voice recognition software. Voice recognition is usually quite accurate but there are transcription errors that can and very often do occur. I apologize for any typographical errors that were not detected and corrected.     Earleen Newport, MD 11/08/2016 [phone]

## 2016-11-06 NOTE — H&P (Signed)
[facility] [place] NAME: Melissa Palmer    MR#:  [id number]  DATE OF BIRTH:  1927-12-15  DATE OF ADMISSION:  10/16/2016  PRIMARY CARE PHYSICIAN: Denita Lung, DO   REQUESTING/REFERRING PHYSICIAN: Earleen Newport, MD  CHIEF COMPLAINT:   Chief Complaint  Patient presents with  . Muscle Pain   Chest pain on the left side. HISTORY OF PRESENT ILLNESS:  Melissa Palmer  is a 83 y.o. female with a known history of Lung cancer, status post radiation, A. fib, status post pacemaker, HTN, dementia, hypertension, CVA and TIA. The patient presented to the ED with chest pain on the left side in radiation area. She has a history of lung cancer on the left side.  She has not had radiation since last October. She denies any fever or chills but has shortness of breath and cough. CT angiogram of the chest didn't show any PE but left-sided consolidation and possible pneumonia. PAST MEDICAL HISTORY:   Past Medical History:  Diagnosis Date  . A-fib ([place])   . Cancer ([place])    lung ca  . Cataract   . Dementia   . Hypertension   . Hypertension   . TIA (transient ischemic attack)     PAST SURGICAL HISTORY:   Past Surgical History:  Procedure Laterality Date  . BREAST SURGERY    . COLONOSCOPY    . HYSTEROTOMY    . UPPER GI ENDOSCOPY      SOCIAL HISTORY:   Social History  Substance Use Topics  . Smoking status: Former Research scientist (life sciences)  . Smokeless tobacco: Never Used  . Alcohol use No    FAMILY HISTORY:  History reviewed. No pertinent family history. Both parents deceased. She doesn't know.  DRUG ALLERGIES:   Allergies  Allergen Reactions  . Asa [Aspirin] Anaphylaxis  . Flagyl [Metronidazole] Itching  . Hydrochlorothiazide Itching  . Sulfa Antibiotics Itching    REVIEW OF SYSTEMS:   Review of Systems  Constitutional: Positive for chills, fever and malaise/fatigue.  HENT: Negative for congestion.   Eyes: Negative for blurred vision and  double vision.  Respiratory: Positive for cough and shortness of breath. Negative for stridor.   Cardiovascular: Negative for chest pain and leg swelling.  Gastrointestinal: Negative for abdominal pain, blood in stool, melena, nausea and vomiting.  Genitourinary: Negative for dysuria and hematuria.  Musculoskeletal: Negative for back pain.  Neurological: Negative for dizziness, focal weakness and loss of consciousness.  Psychiatric/Behavioral: Negative for depression. The patient is not nervous/anxious.     MEDICATIONS AT HOME:   Prior to Admission medications   Medication Sig Start Date End Date Taking? Authorizing Provider  albuterol (PROVENTIL HFA;VENTOLIN HFA) 108 (90 Base) MCG/ACT inhaler Inhale 2 puffs into the lungs every 6 (six) hours as needed for wheezing or shortness of breath. 09/19/16  Yes Cammie Sickle, MD  ALPRAZolam Duanne Moron) 0.5 MG tablet Take 0.5-1 mg by mouth 2 (two) times daily. Take 0.'25mg'$  in morning and take 0.'5mg'$  at night   Yes Historical Provider, MD  apixaban (ELIQUIS) 2.5 MG TABS tablet Take 1 tablet (2.5 mg total) by mouth 2 (two) times daily. 03/08/16  Yes Bettey Costa, MD  Cholecalciferol (VITAMIN D-1000 MAX ST) 1000 units tablet Take 1 tablet by mouth daily.   Yes Historical Provider, MD  cloNIDine (CATAPRES) 0.1 MG tablet Take 0.1 mg by mouth at bedtime.   Yes Historical Provider, MD  diltiazem (CARDIZEM CD) 360 MG 24 hr capsule Take 360 mg  by mouth daily.   Yes Historical Provider, MD  fluvoxaMINE (LUVOX) 25 MG tablet Take 25 mg by mouth daily at 12 noon.   Yes Historical Provider, MD  loratadine (CLARITIN) 10 MG tablet Take 10 mg by mouth daily.   Yes Historical Provider, MD  pantoprazole (PROTONIX) 20 MG tablet Take 20 mg by mouth daily.   Yes Historical Provider, MD  multivitamin (ONE-A-DAY MEN'S) TABS tablet Take by mouth.    Historical Provider, MD  predniSONE (DELTASONE) 20 MG tablet Take 2 pills once a day by mouth for 2 weeks; then take 1 pill once a  day by mouth for 4 weeks; with food; in AM  Continue taking until next appt with Dr. Jacinto Reap. Patient not taking: Reported on 11/08/2016 10/12/16   Lucendia Herrlich, NP      VITAL SIGNS:  Blood pressure (!) 150/49, pulse 81, temperature 98.6 F (37 C), resp. rate 18, height '5\' 4"'$  (1.626 m), weight 136 lb 3.2 oz (61.8 kg), SpO2 96 %.  PHYSICAL EXAMINATION:  Physical Exam  GENERAL:  83 y.o.-year-old patient lying in the bed with no acute distress.  EYES: Pupils equal, round, reactive to light and accommodation. No scleral icterus. Extraocular muscles intact.  HEENT: Head atraumatic, normocephalic. Oropharynx and nasopharynx clear. Dry oral mucosa. NECK:  Supple, no jugular venous distention. No thyroid enlargement, no tenderness.  LUNGS: Normal breath sounds bilaterally, no wheezing, crackles on the left side. No use of accessory muscles of respiration.  CARDIOVASCULAR: S1, S2 normal. No murmurs, rubs, or gallops.  ABDOMEN: Soft, nontender, nondistended. Bowel sounds present. No organomegaly or mass.  EXTREMITIES: No pedal edema, cyanosis, or clubbing.  NEUROLOGIC: Cranial nerves II through XII are intact. Muscle strength 5/5 in all extremities. Sensation intact. Gait not checked.  PSYCHIATRIC: The patient is alert and oriented x 3.  looks demented. SKIN: No obvious rash, lesion, or ulcer.   LABORATORY PANEL:   CBC  Recent Labs Lab 10/31/2016 0732  WBC 7.0  HGB 11.2*  HCT 34.5*  PLT 283   ------------------------------------------------------------------------------------------------------------------  Chemistries   Recent Labs Lab 11/10/2016 0732  NA 136  K 3.7  CL 103  CO2 26  GLUCOSE 110*  BUN 16  CREATININE 1.36*  CALCIUM 8.8*  AST 19  ALT 26  ALKPHOS 42  BILITOT 0.6   ------------------------------------------------------------------------------------------------------------------  Cardiac Enzymes  Recent Labs Lab 10/16/2016 0732  TROPONINI <0.03    ------------------------------------------------------------------------------------------------------------------  RADIOLOGY:  Ct Head Wo Contrast  Result Date: 11/02/2016 CLINICAL DATA:  Body aches and generalized weakness for 3 months. EXAM: CT HEAD WITHOUT CONTRAST TECHNIQUE: Contiguous axial images were obtained from the base of the skull through the vertex without intravenous contrast. COMPARISON:  Head CT scan 03/08/2016. FINDINGS: Brain: The brain is atrophic with fairly extensive chronic microvascular ischemic change. Remote right frontal infarct is noted. No acute abnormality including infarction, hemorrhage, mass lesion, mass effect, midline shift or abnormal extra-axial fluid collection. No hydrocephalus or pneumocephalus. Vascular: Atherosclerosis noted. Skull: Intact. Sinuses/Orbits: No acute abnormality. Other: None. IMPRESSION: No acute abnormality. Atrophy, chronic microvascular ischemic change remote right frontal infarct. Atherosclerosis. Electronically Signed   By: Inge Rise M.D.   On: 11/07/2016 11:10   Ct Angio Chest Pe W And/or Wo Contrast  Result Date: 10/30/2016 CLINICAL DATA:  Cough and chest pain for 1 week. Finished radiation therapy in October for lung cancer. EXAM: CT ANGIOGRAPHY CHEST WITH CONTRAST TECHNIQUE: Multidetector CT imaging of the chest was performed using the standard protocol during bolus administration  of intravenous contrast. Multiplanar CT image reconstructions and MIPs were obtained to evaluate the vascular anatomy. CONTRAST:  60 cc of Isovue 370 COMPARISON:  09/26/2016 FINDINGS: Cardiovascular: The quality of this exam for evaluation of pulmonary embolism is good. The only limitation is minimal motion inferiorly. No evidence of pulmonary embolism. Extensive colonic diverticulosis. Ulcerative plaque in the transverse aorta. Mild cardiomegaly, without pericardial effusion. Multivessel coronary artery atherosclerosis. Mediastinum/Nodes: A node within  the azygoesophageal recess measures 12 mm on image 45/series 4 versus maximally 7 mm on the prior exam. No hilar adenopathy. Lungs/Pleura: Slight increase in small left pleural effusion. Trace right pleural fluid or thickening. Moderate centrilobular emphysema. 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 Basilar predominant subpleural reticulation with architectural distortion. Left lower lobe consolidation with only peripheral air bronchograms, increased. Underlying fiducials. Upper Abdomen: Normal imaged portions of the liver, spleen, stomach, pancreas, adrenal glands, kidneys. Musculoskeletal: Diminutive right fifth rib is likely developmental. Review of the MIP images confirms the above findings. IMPRESSION: 1.  No evidence of pulmonary embolism.  Minimal motion degradation. 2. Increase in left lower lobe consolidation with slight increase in small left pleural effusion. Increased consolidation could be due to evolving radiation change. Residual/recurrent disease cannot be excluded. 3. Developing adenopathy within the azygoesophageal recess station of the mediastinum. Suspicious for nodal metastasis. Reactive adenopathy could look similar. This could be re-evaluated with short term follow-up CT or further characterized with PET. 4.  Coronary artery atherosclerosis. Aortic atherosclerosis. 5. 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 Electronically Signed   By: Abigail Miyamoto M.D.   On: 10/14/2016 09:22      IMPRESSION AND PLAN:   Pneumonia CAP The patient will be admitted to medical floor. Continue Levaquin, DuoNeb when necessary, Robitussin when necessary, follow-up cultures and a CBC.  UTI. Continue Rocephin, follow-up urine culture.   Acute renal failure. IV fluid support and follow-up BMP.   History of A. fib, status post pacemaker placement. Continue Eliquis and Cardizem.  All the records are reviewed and case discussed with ED  provider. Management plans discussed with the patient, her daughters and they are in agreement.  CODE STATUS: Full code  TOTAL TIME TAKING CARE OF THIS PATIENT: 56 minutes.    Demetrios Loll M.D on 10/12/2016 at 1:36 PM  Between 7am to 6pm - Pager - [phone]  After 6pm go to [email or web address] - Proofreader  [facility]  Office  [phone]  CC: Primary care physician; Denita Lung, DO   Note: This dictation was prepared with Dragon dictation along with smaller phrase technology. Any transcriptional errors that result from this process are unintentional.

## 2016-11-06 NOTE — ED Triage Notes (Signed)
Pt arrives POV to triage with c/o body pain due to radiation therapy for a spot on lungs. Pt was at Dr. Gwenlyn Perking week ago and was told that "everything was looking fine". Pt has a pacemaker and lung cancer at this time.

## 2016-11-06 NOTE — Plan of Care (Signed)
Problem: Activity: Goal: Ability to tolerate increased activity will improve Outcome: Not Progressing Pt is 1-2 assist to bedside commode. Pt is very weak

## 2016-11-07 ENCOUNTER — Encounter: Payer: Self-pay | Admitting: Internal Medicine

## 2016-11-07 ENCOUNTER — Inpatient Hospital Stay: Payer: Medicare HMO

## 2016-11-07 LAB — BASIC METABOLIC PANEL
ANION GAP: 5 (ref 5–15)
BUN: 17 mg/dL (ref 6–20)
CO2: 23 mmol/L (ref 22–32)
Calcium: 8.1 mg/dL — ABNORMAL LOW (ref 8.9–10.3)
Chloride: 107 mmol/L (ref 101–111)
Creatinine, Ser: 1.37 mg/dL — ABNORMAL HIGH (ref 0.44–1.00)
GFR calc non Af Amer: 34 mL/min — ABNORMAL LOW (ref >60)
GFR, EST AFRICAN AMERICAN: 39 mL/min — AB (ref >60)
GLUCOSE: 118 mg/dL — AB (ref 65–99)
POTASSIUM: 3.9 mmol/L (ref 3.5–5.1)
Sodium: 135 mmol/L (ref 135–145)

## 2016-11-07 LAB — MRSA PCR SCREENING: MRSA BY PCR: NEGATIVE

## 2016-11-07 LAB — CBC
HEMATOCRIT: 32.6 % — AB (ref 35.0–47.0)
HEMOGLOBIN: 10.5 g/dL — AB (ref 12.0–16.0)
MCH: 24.3 pg — ABNORMAL LOW (ref 26.0–34.0)
MCHC: 32.2 g/dL (ref 32.0–36.0)
MCV: 75.3 fL — AB (ref 80.0–100.0)
Platelets: 266 10*3/uL (ref 150–440)
RBC: 4.32 MIL/uL (ref 3.80–5.20)
RDW: 25.6 % — ABNORMAL HIGH (ref 11.5–14.5)
WBC: 8.5 10*3/uL (ref 3.6–11.0)

## 2016-11-07 LAB — TROPONIN I: Troponin I: 0.03 ng/mL (ref <0.03)

## 2016-11-07 LAB — LACTIC ACID, PLASMA: Lactic Acid, Venous: 0.8 mmol/L (ref 0.5–1.9)

## 2016-11-07 MED ORDER — DILTIAZEM HCL 25 MG/5ML IV SOLN
5.0000 mg | Freq: Once | INTRAVENOUS | Status: AC
  Administered 2016-11-07: 5 mg via INTRAVENOUS
  Filled 2016-11-07: qty 5

## 2016-11-07 MED ORDER — VANCOMYCIN HCL IN DEXTROSE 1-5 GM/200ML-% IV SOLN: 1000.0000 mg | Freq: Once | INTRAVENOUS | Status: DC

## 2016-11-07 MED ORDER — POLYETHYLENE GLYCOL 3350 17 G PO PACK: 17.0000 g | PACK | Freq: Every day | ORAL | Status: DC | PRN

## 2016-11-07 MED ORDER — IBUPROFEN 200 MG PO TABS
100.0000 mg | ORAL_TABLET | ORAL | Status: DC | PRN
  Filled 2016-11-07: qty 1

## 2016-11-07 MED ORDER — VANCOMYCIN HCL IN DEXTROSE 1-5 GM/200ML-% IV SOLN: 1000.0000 mg | INTRAVENOUS | Status: DC

## 2016-11-07 MED ORDER — NITROGLYCERIN 2 % TD OINT
1.0000 [in_us] | TOPICAL_OINTMENT | Freq: Four times a day (QID) | TRANSDERMAL | Status: DC
  Administered 2016-11-07 – 2016-11-08 (×3): 1 [in_us] via TOPICAL
  Filled 2016-11-07 (×3): qty 1

## 2016-11-07 MED ORDER — SODIUM CHLORIDE 0.9 % IV SOLN
1250.0000 mg | Freq: Once | INTRAVENOUS | Status: AC
  Administered 2016-11-07: 03:00:00 1250 mg via INTRAVENOUS
  Filled 2016-11-07 (×2): qty 1250

## 2016-11-07 MED ORDER — VANCOMYCIN HCL IN DEXTROSE 750-5 MG/150ML-% IV SOLN
750.0000 mg | INTRAVENOUS | Status: DC
  Filled 2016-11-07 (×2): qty 150

## 2016-11-07 MED ORDER — OXYCODONE HCL 5 MG PO TABS
5.0000 mg | ORAL_TABLET | Freq: Four times a day (QID) | ORAL | Status: DC | PRN
  Administered 2016-11-07 – 2016-11-12 (×10): 5 mg via ORAL
  Filled 2016-11-07 (×11): qty 1

## 2016-11-07 MED ORDER — FUROSEMIDE 10 MG/ML IJ SOLN
40.0000 mg | Freq: Once | INTRAMUSCULAR | Status: AC
  Administered 2016-11-07: 40 mg via INTRAVENOUS
  Filled 2016-11-07: qty 4

## 2016-11-07 MED ORDER — OXYCODONE HCL 5 MG PO TABS
5.0000 mg | ORAL_TABLET | Freq: Once | ORAL | Status: AC
  Administered 2016-11-07: 01:00:00 5 mg via ORAL
  Filled 2016-11-07: qty 1

## 2016-11-07 MED ORDER — LISINOPRIL 5 MG PO TABS
5.0000 mg | ORAL_TABLET | Freq: Every day | ORAL | Status: DC
  Administered 2016-11-07: 18:00:00 5 mg via ORAL
  Filled 2016-11-07 (×2): qty 1

## 2016-11-07 MED ORDER — ACETAMINOPHEN 325 MG PO TABS
325.0000 mg | ORAL_TABLET | Freq: Once | ORAL | Status: AC
  Administered 2016-11-07: 325 mg via ORAL
  Filled 2016-11-07: qty 1

## 2016-11-07 MED ORDER — PIPERACILLIN-TAZOBACTAM 3.375 G IVPB
3.3750 g | Freq: Three times a day (TID) | INTRAVENOUS | Status: DC
  Administered 2016-11-07 – 2016-11-10 (×11): 3.375 g via INTRAVENOUS
  Filled 2016-11-07 (×12): qty 50

## 2016-11-07 MED ORDER — ALPRAZOLAM 0.5 MG PO TABS
0.2500 mg | ORAL_TABLET | Freq: Two times a day (BID) | ORAL | Status: DC | PRN
  Administered 2016-11-08 – 2016-11-12 (×9): 0.25 mg via ORAL
  Filled 2016-11-07 (×8): qty 1

## 2016-11-07 NOTE — Progress Notes (Signed)
Pt having continuous 9/10 left lower chest pain which pt states has been chronic and unchanged for days, related to her pneumonia.  Pt is reminded frequently that she has pain medications available but pt is adamant that she does not want any pain medication.  Room is made as comfortable as possible.  Will continue to monitor and offer pain medications.  Pt is advised to press call button if she has any changes in her pain.  Pt states that she understands and will do so.

## 2016-11-07 NOTE — Progress Notes (Signed)
MD notified of BP of 182/66, temp of 103, and HR of 110 after IV labetalol and tylenol given. Orders received to give IV cardizem, another dose of tylenol, and check lactic acid. Ammie Dalton, RN

## 2016-11-07 NOTE — Progress Notes (Signed)
Notified MD of pt's BP of 198/72, HR 126, temp of 102.5. Orders received to give IV labetalol and tylenol. Ammie Dalton, RN

## 2016-11-07 NOTE — Progress Notes (Addendum)
Pharmacy Antibiotic Note  Mariellen Blaney is a 83 y.o. female admitted on 11/02/2016 with pneumonia.  Pharmacy has been consulted for vancomycin/zosyn dosing.  Plan: Will administer vanc 1.25 g IV load x 1  Will initiate vanc 750 mg q24h and will check a VT 3/29 @ 0100 prior to 3rd dose to ensure patient is clearing. Will start zosyn 3.375g IV q8h extended infusion.  Height: '5\' 4"'$  (162.6 cm) Weight: 136 lb 3.2 oz (61.8 kg) IBW/kg (Calculated) : 54.7  Temp (24hrs), Avg:100.1 F (37.8 C), Min:97.5 F (36.4 C), Max:103.2 F (39.6 C)   Recent Labs Lab 11/10/2016 0732  WBC 7.0  CREATININE 1.36*    Estimated Creatinine Clearance: 25.6 mL/min (A) (by C-G formula based on SCr of 1.36 mg/dL (H)).    Allergies  Allergen Reactions  . Asa [Aspirin] Anaphylaxis  . Flagyl [Metronidazole] Itching  . Hydrochlorothiazide Itching  . Sulfa Antibiotics Itching    Thank you for allowing pharmacy to be a part of this patient's care.  Tobie Lords, PharmD, BCPS Clinical Pharmacist 11/07/2016

## 2016-11-07 NOTE — Progress Notes (Signed)
PT Cancellation Note  Patient Details Name: Melissa Palmer MRN: [id number] DOB: 05-20-1928   Cancelled Treatment:    Reason Eval/Treat Not Completed: Patient not medically ready Attempted to see pt this afternoon.  She had recently gotten back to bed from getting to the [facility] with nursing.  She was breathing heavily and reported not feeling like she could do anything.  PT checked her HR and O2 with ~2 minutes with O2 only slowly rising from mid 70s to high 70s and HR staying in the 120s, she clearly was not appropriate for PT at this time and nursing was notified.  PT did talk with daughter (whom she lives with) and reports that for the last month she has struggled to get around much.  She does not use an AD or O2, has 3 steps (with L rail) to enter home, she is only rarely out of the house and pt regularly is home alone for 8 hrs at a time while daughter is at work - there is other family that can help but also work.    Kreg Shropshire 11/07/2016, 5:19 PM

## 2016-11-07 NOTE — Progress Notes (Signed)
   11/07/16 1719  Vitals  Temp (!) 101.7 F (38.7 C)  Temp Source Oral  BP (!) 194/70  Pulse Rate (!) 121  Oxygen Therapy  SpO2 92 %  O2 Device Nasal Cannula  O2 Flow Rate (L/min) 5 L/min   Made Dr. Ether Griffins aware of pt's current vitals and new need for O2.  New order received.  Will call back Dr. Ether Griffins with results.  Clarise Cruz, RN

## 2016-11-07 NOTE — Progress Notes (Signed)
[place] [facility] [place] NAME: Celeste Tavenner    MR#:  [id number]  DATE OF BIRTH:  [birth year]  SUBJECTIVE:  CHIEF COMPLAINT:   Chief Complaint  Patient presents with  . Muscle Pain   No SOB Weakness Chronic left chest pain due to lung ca Daughter at bedside  REVIEW OF SYSTEMS:    Review of Systems  Unable to perform ROS: Dementia    DRUG ALLERGIES:   Allergies  Allergen Reactions  . Asa [Aspirin] Anaphylaxis  . Flagyl [Metronidazole] Itching  . Hydrochlorothiazide Itching  . Sulfa Antibiotics Itching    VITALS:  Blood pressure (!) 173/94, pulse 100, temperature 97.7 F (36.5 C), temperature source Axillary, resp. rate 20, height '5\' 4"'$  (1.626 m), weight 61.8 kg (136 lb 3.2 oz), SpO2 95 %.  PHYSICAL EXAMINATION:   Physical Exam  GENERAL:  83 y.o.-year-old patient lying in the bed with no acute distress.  EYES: Pupils equal, round, reactive to light and accommodation. No scleral icterus. Extraocular muscles intact.  HEENT: Head atraumatic, normocephalic. Oropharynx and nasopharynx clear.  NECK:  Supple, no jugular venous distention. No thyroid enlargement, no tenderness.  LUNGS: Normal breath sounds bilaterally, no wheezing, rales, rhonchi. No use of accessory muscles of respiration.  CARDIOVASCULAR: S1, S2 normal. No murmurs, rubs, or gallops.  ABDOMEN: Soft, nontender, nondistended. Bowel sounds present. No organomegaly or mass.  Rectal exam showed skin tags. No hemorrhoids  EXTREMITIES: No cyanosis, clubbing or edema b/l.    NEUROLOGIC: Cranial nerves II through XII are intact. No focal Motor or sensory deficits b/l.   PSYCHIATRIC: The patient is alert and awake. Pleasantly confused SKIN: No obvious rash, lesion, or ulcer.   LABORATORY PANEL:   CBC  Recent Labs Lab 11/07/16 0138  WBC 8.5  HGB 10.5*  HCT 32.6*  PLT 266    ------------------------------------------------------------------------------------------------------------------ Chemistries   Recent Labs Lab 10/26/2016 0732 11/07/16 0138  NA 136 135  K 3.7 3.9  CL 103 107  CO2 26 23  GLUCOSE 110* 118*  BUN 16 17  CREATININE 1.36* 1.37*  CALCIUM 8.8* 8.1*  AST 19  --   ALT 26  --   ALKPHOS 42  --   BILITOT 0.6  --    ------------------------------------------------------------------------------------------------------------------  Cardiac Enzymes  Recent Labs Lab 10/27/2016 0732  TROPONINI <0.03   ------------------------------------------------------------------------------------------------------------------  RADIOLOGY:  Ct Head Wo Contrast  Result Date: 10/20/2016 CLINICAL DATA:  Body aches and generalized weakness for 3 months. EXAM: CT HEAD WITHOUT CONTRAST TECHNIQUE: Contiguous axial images were obtained from the base of the skull through the vertex without intravenous contrast. COMPARISON:  Head CT scan 03/08/2016. FINDINGS: Brain: The brain is atrophic with fairly extensive chronic microvascular ischemic change. Remote right frontal infarct is noted. No acute abnormality including infarction, hemorrhage, mass lesion, mass effect, midline shift or abnormal extra-axial fluid collection. No hydrocephalus or pneumocephalus. Vascular: Atherosclerosis noted. Skull: Intact. Sinuses/Orbits: No acute abnormality. Other: None. IMPRESSION: No acute abnormality. Atrophy, chronic microvascular ischemic change remote right frontal infarct. Atherosclerosis. Electronically Signed   By: Inge Rise M.D.   On: 11/09/2016 11:10   Ct Angio Chest Pe W And/or Wo Contrast  Result Date: 11/11/2016 CLINICAL DATA:  Cough and chest pain for 1 week. Finished radiation therapy in October for lung cancer. EXAM: CT ANGIOGRAPHY CHEST WITH CONTRAST TECHNIQUE: Multidetector CT imaging of the chest was performed using the standard protocol during bolus  administration of intravenous contrast. Multiplanar CT image reconstructions and MIPs were  obtained to evaluate the vascular anatomy. CONTRAST:  60 cc of Isovue 370 COMPARISON:  09/26/2016 FINDINGS: Cardiovascular: The quality of this exam for evaluation of pulmonary embolism is good. The only limitation is minimal motion inferiorly. No evidence of pulmonary embolism. Extensive colonic diverticulosis. Ulcerative plaque in the transverse aorta. Mild cardiomegaly, without pericardial effusion. Multivessel coronary artery atherosclerosis. Mediastinum/Nodes: A node within the azygoesophageal recess measures 12 mm on image 45/series 4 versus maximally 7 mm on the prior exam. No hilar adenopathy. Lungs/Pleura: Slight increase in small left pleural effusion. Trace right pleural fluid or thickening. Moderate centrilobular emphysema. 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 Basilar predominant subpleural reticulation with architectural distortion. Left lower lobe consolidation with only peripheral air bronchograms, increased. Underlying fiducials. Upper Abdomen: Normal imaged portions of the liver, spleen, stomach, pancreas, adrenal glands, kidneys. Musculoskeletal: Diminutive right fifth rib is likely developmental. Review of the MIP images confirms the above findings. IMPRESSION: 1.  No evidence of pulmonary embolism.  Minimal motion degradation. 2. Increase in left lower lobe consolidation with slight increase in small left pleural effusion. Increased consolidation could be due to evolving radiation change. Residual/recurrent disease cannot be excluded. 3. Developing adenopathy within the azygoesophageal recess station of the mediastinum. Suspicious for nodal metastasis. Reactive adenopathy could look similar. This could be re-evaluated with short term follow-up CT or further characterized with PET. 4.  Coronary artery atherosclerosis. Aortic atherosclerosis. 5. Interstitial lung disease, likely  usual interstitial pneumonitis. Electronically Signed   By: Abigail Miyamoto M.D.   On: 11/07/2016 09:22     ASSESSMENT AND PLAN:   * Sepsis POA due to HCAP and UTI IVF Lactic acid normal Resolved  * LLL HCAP Could be post-obstructive PNA from her lung ca or radiation changes But has fever ON IV abx Check MRSA PCR Has been abx for same over past few months.  * UTI On IV abx Cx pending  * Rectal bleedng 1 episode last week Hb stable Likely hemorrhoidal Has OP f/u with Dr. Epimenio Foot in [place] at cancer center  * Afib On eliquis  * Confusion Likely has underlying early dementia. This has worsened with recent use of prednisone for her cancer. Improving per daughter.  All the records are reviewed and case discussed with Care Management/Social Workerr. Management plans discussed with the patient, family and they are in agreement.  CODE STATUS: FULL CODE  DVT Prophylaxis: SCDs  TOTAL TIME TAKING CARE OF THIS PATIENT: 35 minutes.   POSSIBLE D/C IN 1-2 DAYS, DEPENDING ON CLINICAL CONDITION.  Hillary Bow R M.D on 11/07/2016 at 1:26 PM  Between 7am to 6pm - Pager - [phone]  After 6pm go to [email or web address] - password [email or web address] [facility]  Office  [phone]  CC: Primary care physician; Denita Lung, DO  Note: This dictation was prepared with Dragon dictation along with smaller phrase technology. Any transcriptional errors that result from this process are unintentional.

## 2016-11-08 ENCOUNTER — Inpatient Hospital Stay: Payer: Medicare HMO

## 2016-11-08 DIAGNOSIS — C3432 Malignant neoplasm of lower lobe, left bronchus or lung: Secondary | ICD-10-CM

## 2016-11-08 DIAGNOSIS — D649 Anemia, unspecified: Secondary | ICD-10-CM

## 2016-11-08 DIAGNOSIS — J9600 Acute respiratory failure, unspecified whether with hypoxia or hypercapnia: Secondary | ICD-10-CM

## 2016-11-08 DIAGNOSIS — R630 Anorexia: Secondary | ICD-10-CM

## 2016-11-08 DIAGNOSIS — R0602 Shortness of breath: Secondary | ICD-10-CM

## 2016-11-08 DIAGNOSIS — Z79899 Other long term (current) drug therapy: Secondary | ICD-10-CM

## 2016-11-08 DIAGNOSIS — I129 Hypertensive chronic kidney disease with stage 1 through stage 4 chronic kidney disease, or unspecified chronic kidney disease: Secondary | ICD-10-CM

## 2016-11-08 DIAGNOSIS — Z8673 Personal history of transient ischemic attack (TIA), and cerebral infarction without residual deficits: Secondary | ICD-10-CM

## 2016-11-08 DIAGNOSIS — F0390 Unspecified dementia without behavioral disturbance: Secondary | ICD-10-CM

## 2016-11-08 DIAGNOSIS — Z87891 Personal history of nicotine dependence: Secondary | ICD-10-CM

## 2016-11-08 DIAGNOSIS — N390 Urinary tract infection, site not specified: Secondary | ICD-10-CM

## 2016-11-08 DIAGNOSIS — Z66 Do not resuscitate: Secondary | ICD-10-CM

## 2016-11-08 DIAGNOSIS — I4891 Unspecified atrial fibrillation: Secondary | ICD-10-CM

## 2016-11-08 DIAGNOSIS — Z7982 Long term (current) use of aspirin: Secondary | ICD-10-CM

## 2016-11-08 DIAGNOSIS — N189 Chronic kidney disease, unspecified: Secondary | ICD-10-CM

## 2016-11-08 DIAGNOSIS — R4182 Altered mental status, unspecified: Secondary | ICD-10-CM

## 2016-11-08 LAB — BASIC METABOLIC PANEL
ANION GAP: 6 (ref 5–15)
BUN: 19 mg/dL (ref 6–20)
CHLORIDE: 104 mmol/L (ref 101–111)
CO2: 27 mmol/L (ref 22–32)
Calcium: 7.9 mg/dL — ABNORMAL LOW (ref 8.9–10.3)
Creatinine, Ser: 1.72 mg/dL — ABNORMAL HIGH (ref 0.44–1.00)
GFR calc non Af Amer: 26 mL/min — ABNORMAL LOW (ref >60)
GFR, EST AFRICAN AMERICAN: 30 mL/min — AB (ref >60)
GLUCOSE: 117 mg/dL — AB (ref 65–99)
Potassium: 3.2 mmol/L — ABNORMAL LOW (ref 3.5–5.1)
Sodium: 137 mmol/L (ref 135–145)

## 2016-11-08 LAB — CBC
HCT: 32.2 % — ABNORMAL LOW (ref 35.0–47.0)
Hemoglobin: 10.5 g/dL — ABNORMAL LOW (ref 12.0–16.0)
MCH: 23.9 pg — ABNORMAL LOW (ref 26.0–34.0)
MCHC: 32.5 g/dL (ref 32.0–36.0)
MCV: 73.6 fL — ABNORMAL LOW (ref 80.0–100.0)
Platelets: 299 K/uL (ref 150–440)
RBC: 4.38 MIL/uL (ref 3.80–5.20)
RDW: 25.6 % — ABNORMAL HIGH (ref 11.5–14.5)
WBC: 9.7 K/uL (ref 3.6–11.0)

## 2016-11-08 LAB — URINE CULTURE: Culture: >=100000 — AB

## 2016-11-08 LAB — LACTIC ACID, PLASMA: Lactic Acid, Venous: 0.9 mmol/L (ref 0.5–1.9)

## 2016-11-08 LAB — TROPONIN I: Troponin I: 0.03 ng/mL

## 2016-11-08 MED ORDER — HYDROMORPHONE HCL 1 MG/ML IJ SOLN
0.5000 mg | INTRAMUSCULAR | Status: DC | PRN
  Administered 2016-11-08 (×2): 0.5 mg via INTRAVENOUS
  Filled 2016-11-08 (×2): qty 0.5

## 2016-11-08 MED ORDER — METHYLPREDNISOLONE SODIUM SUCC 125 MG IJ SOLR
60.0000 mg | Freq: Every day | INTRAMUSCULAR | Status: DC
  Administered 2016-11-08: 10:00:00 60 mg via INTRAVENOUS
  Filled 2016-11-08: qty 2

## 2016-11-08 MED ORDER — IPRATROPIUM-ALBUTEROL 0.5-2.5 (3) MG/3ML IN SOLN
3.0000 mL | Freq: Four times a day (QID) | RESPIRATORY_TRACT | Status: DC
  Administered 2016-11-08 – 2016-11-13 (×20): 3 mL via RESPIRATORY_TRACT
  Filled 2016-11-08 (×20): qty 3

## 2016-11-08 MED ORDER — SODIUM CHLORIDE 0.9 % IV SOLN
30.0000 meq | Freq: Once | INTRAVENOUS | Status: AC
  Administered 2016-11-08: 14:00:00 30 meq via INTRAVENOUS
  Filled 2016-11-08: qty 15

## 2016-11-08 MED ORDER — LIDOCAINE 5 % EX PTCH
1.0000 | MEDICATED_PATCH | CUTANEOUS | Status: DC
  Administered 2016-11-08 – 2016-11-14 (×7): 1 via TRANSDERMAL
  Filled 2016-11-08 (×9): qty 1

## 2016-11-08 NOTE — Progress Notes (Signed)
PT Cancellation Note  Patient Details Name: Shadie Sweatman MRN: [id number] DOB: 03-30-1928   Cancelled Treatment:    Reason Eval/Treat Not Completed: Patient not medically ready. Nursing reported that pt is not medically appropriate at this time. PT will check back at a later date/time when pt is medically appropriate.   Delmo Matty, SPT 11/08/2016, 10:31 AM

## 2016-11-08 NOTE — Progress Notes (Signed)
[place] at [place] NAME: Melissa Palmer    MR#:  [id number]  DATE OF BIRTH:  [birth year]  SUBJECTIVE:  CHIEF COMPLAINT:   Chief Complaint  Patient presents with  . Muscle Pain   -Rapid decompensation of her condition yesterday. After working with physical therapy, patient became acutely hypoxic, has been complaining of generalized pain all over. -sedated this morning due to receiving pain medications -Family at bedside and concerned  REVIEW OF SYSTEMS:  Review of Systems  Constitutional: Positive for fever and malaise/fatigue. Negative for chills.  HENT: Negative for congestion, ear discharge, hearing loss and nosebleeds.   Eyes: Negative for blurred vision and double vision.  Respiratory: Positive for cough and shortness of breath. Negative for sputum production.   Cardiovascular: Positive for chest pain. Negative for leg swelling.  Gastrointestinal: Negative for abdominal pain, constipation, diarrhea, heartburn and vomiting.  Genitourinary: Negative for dysuria.  Musculoskeletal: Positive for back pain and myalgias.  Neurological: Positive for weakness. Negative for dizziness, sensory change, speech change, focal weakness and seizures.  Psychiatric/Behavioral: Negative for depression.    DRUG ALLERGIES:   Allergies  Allergen Reactions  . Asa [Aspirin] Anaphylaxis  . Flagyl [Metronidazole] Itching  . Hydrochlorothiazide Itching  . Sulfa Antibiotics Itching    VITALS:  Blood pressure (!) 101/51, pulse 99, temperature 98.2 F (36.8 C), temperature source Oral, resp. rate 20, height '5\' 4"'$  (1.626 m), weight 61.8 kg (136 lb 3.2 oz), SpO2 91 %.  PHYSICAL EXAMINATION:  Physical Exam  GENERAL:  83 y.o.-year-old patient lying in the bed with no acute distress. sleepy EYES: Pupils equal, round, reactive to light and accommodation. No scleral icterus. Extraocular muscles intact.  HEENT: Head atraumatic, normocephalic. Oropharynx and  nasopharynx clear.  NECK:  Supple, no jugular venous distention. No thyroid enlargement, no tenderness.  LUNGS: Normal breath sounds bilaterally, no wheezing. Bibasilar crackles noted.  No use of accessory muscles of respiration. Rapid shallow breaths, decreased breath sounds at the bases CARDIOVASCULAR: S1, S2 normal. No murmurs, rubs, or gallops.  ABDOMEN: Soft, nontender, nondistended. Bowel sounds present. No organomegaly or mass.  EXTREMITIES: No pedal edema, cyanosis, or clubbing.  NEUROLOGIC: Cranial nerves II through XII are intact. Muscle strength 5/5 in all extremities. Sensation intact. Gait not checked. Global weakness noted. PSYCHIATRIC: The patient is alert and oriented x 1-2.  SKIN: No obvious rash, lesion, or ulcer.    LABORATORY PANEL:   CBC  Recent Labs Lab 11/08/16 0513  WBC 9.7  HGB 10.5*  HCT 32.2*  PLT 299   ------------------------------------------------------------------------------------------------------------------  Chemistries   Recent Labs Lab 10/26/2016 0732  11/08/16 0513  NA 136  < > 137  K 3.7  < > 3.2*  CL 103  < > 104  CO2 26  < > 27  GLUCOSE 110*  < > 117*  BUN 16  < > 19  CREATININE 1.36*  < > 1.72*  CALCIUM 8.8*  < > 7.9*  AST 19  --   --   ALT 26  --   --   ALKPHOS 42  --   --   BILITOT 0.6  --   --   < > = values in this interval not displayed. ------------------------------------------------------------------------------------------------------------------  Cardiac Enzymes  Recent Labs Lab 11/08/16 0513  TROPONINI 0.03*   ------------------------------------------------------------------------------------------------------------------  RADIOLOGY:  Ct Head Wo Contrast  Result Date: 10/21/2016 CLINICAL DATA:  Body aches and generalized weakness for 3 months. EXAM: CT HEAD WITHOUT CONTRAST TECHNIQUE:  Contiguous axial images were obtained from the base of the skull through the vertex without intravenous contrast.  COMPARISON:  Head CT scan 03/08/2016. FINDINGS: Brain: The brain is atrophic with fairly extensive chronic microvascular ischemic change. Remote right frontal infarct is noted. No acute abnormality including infarction, hemorrhage, mass lesion, mass effect, midline shift or abnormal extra-axial fluid collection. No hydrocephalus or pneumocephalus. Vascular: Atherosclerosis noted. Skull: Intact. Sinuses/Orbits: No acute abnormality. Other: None. IMPRESSION: No acute abnormality. Atrophy, chronic microvascular ischemic change remote right frontal infarct. Atherosclerosis. Electronically Signed   By: Inge Rise M.D.   On: 10/14/2016 11:10   Dg Chest Port 1 View  Result Date: 11/08/2016 CLINICAL DATA:  Pneumonia. EXAM: PORTABLE CHEST 1 VIEW COMPARISON:  11/07/2016 .  CT 10/15/2016. FINDINGS: Mediastinum is stable. Cardiac pacer in stable position. Heart size stable. Surgical clips noted over the lower chest. Persistent density is noted in the left lung base consistent with treatment changes and/or residual or recurrent tumor. Stable bilateral interstitial prominence. Pneumonitis cannot be excluded. Small bilateral pleural effusions. No pneumothorax. IMPRESSION: 1. Persistent persistent density in the left lung base without interim change. This could represent treatment changes and/or residual/recurrent tumor. Pneumonia cannot be excluded. Stable bilateral interstitial prominence. Pneumonitis cannot be excluded. 2. Cardiac pacer stable position. Stable cardiomegaly . No pulmonary venous congestion. Small bilateral pleural effusions. Electronically Signed   By: Marcello Moores  Register   On: 11/08/2016 06:37   Dg Chest Port 1 View  Result Date: 11/07/2016 CLINICAL DATA:  Hypoxia. Left lower lobe lung cancer treated with radiation therapy in October. EXAM: PORTABLE CHEST 1 VIEW COMPARISON:  09/19/2016 chest radiograph. FINDINGS: Stable configuration of 2 lead right subclavian pacemaker. Fiducial markers overlie the  left lower lung. Stable cardiomediastinal silhouette with top-normal heart size and aortic atherosclerosis. No pneumothorax. Stable small left pleural effusion. No right pleural effusion. No pulmonary edema. Patchy consolidation at the left lung base appears slightly worsened since the CT study from 1 day prior. Extensive reticular interstitial opacities at the lung bases appear unchanged. IMPRESSION: 1. Stable small left pleural effusion. 2. Slight worsening of patchy left lung base consolidation, which could represent progression of radiation pneumonitis and/or pneumonia superimposed on tumor. 3. Extensive underlying bibasilar reticular interstitial opacities, suspect underlying interstitial lung disease. 4. Aortic atherosclerosis. Electronically Signed   By: Ilona Sorrel M.D.   On: 11/07/2016 18:22    EKG:   Orders placed or performed during the hospital encounter of 11/07/2016  . ED EKG  . ED EKG  . EKG 12-Lead  . EKG 12-Lead  . EKG 12-Lead  . 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  #1 acute hypoxic respiratory failure- Not on home oxygen, currently on 5 L oxygen. -Secondary to radiation pneumonitis. However rapid decompensation yesterday. -Pulmonary consulted. Nebs as needed. High-dose steroids added today. - possible PNA cannot be excluded- so continue zosyn.  - once pain is improved, encourage incentive spirometry  #2 Sepsis- UTI and PNA - on zosyn, f/u cultures Fevers yesterday  #2 left-sided chest pain-secondary to possible radiation pneumonitis. CT of the chest on admission 2 days ago with no further pneumonia or pulmonary embolism noted. Repeat chest x-rays in the last 2 days  without any acute changes. -  Troponins have remained negative. -Continue pain medications as needed. Lidoderm patch place. Try to decrease severe narcotics as causing her to be sedated and also due to her age.  #3 history of atrial fibrillation-rate controlled, on Cardizem orally. -On eliquis for anticoagulation  #4 left lung cancer-squamous cell carcinoma of left lung base, status post radiation, finished in 2017 October. -Currently not on any chemotherapy actively. Oncology will be consulted  #5 hypokalemia-will be replaced  #6 DVT prophylaxis-patient already on eliquis  #7 hypertension-discontinue nitroglycerin patch as blood pressure is low today On clonidine and lisinopril and Cardizem which will be held this morning   Physical Therapy   All the records are reviewed and case discussed with Care Management/Social Workerr. Management plans discussed with the patient, family and they are in agreement.  CODE STATUS: DNR  TOTAL TIME TAKING CARE OF THIS PATIENT: 41 minutes.   POSSIBLE D/C IN 2-3 DAYS, DEPENDING ON CLINICAL CONDITION.   Gladstone Lighter M.D on 11/08/2016 at 10:22 AM  Between 7am to 6pm - Pager - [phone]  After 6pm go to [email or web address] - password [email or web address] [facility]  Office  [phone]  CC: Primary care physician; Denita Lung, DO

## 2016-11-08 NOTE — Plan of Care (Signed)
Problem: Activity: Goal: Ability to tolerate increased activity will improve Outcome: Not Progressing Pt using bedpan, too weak to transfer to [facility]

## 2016-11-08 NOTE — Consult Note (Signed)
Date: 11/08/2016,   MRN# [id number] Melissa Palmer 14-Jan-1928 Code Status:     Code Status Orders        Start     Ordered   11/07/16 1333  Do not attempt resuscitation (DNR)  Continuous    Question Answer Comment  In the event of cardiac or respiratory ARREST Do not call a "code blue"   In the event of cardiac or respiratory ARREST Do not perform Intubation, CPR, defibrillation or ACLS   In the event of cardiac or respiratory ARREST Use medication by any route, position, wound care, and other measures to relive pain and suffering. May use oxygen, suction and manual treatment of airway obstruction as needed for comfort.      11/07/16 1332    Code Status History    Date Active Date Inactive Code Status Order ID Comments User Context   10/15/2016 12:12 PM 11/07/2016  1:32 PM Full Code [id number]  Demetrios Loll, MD ED   03/07/2016  5:57 PM 03/07/2016  7:05 PM Full Code [id number]  Max Sane, MD Inpatient                      CC: Respiratory distress  HPI: This is an 83 yr old female, DNR, who presents to [place] with left chest wall pain in the setting of known squamous cell lung cancer, s/p xrt, chronic pain, afib, htn, dementia and cva. Today seem to develop more shortness of breath after working with the physical therapy. Last pm received pain meds. Per chart as sedated earlier.    PMHX:   Past Medical History:  Diagnosis Date  . A-fib ([place])   . Cancer ([place])    lung ca  . Cataract   . Dementia   . Hypertension   . Hypertension   . TIA (transient ischemic attack)    Surgical Hx:  Past Surgical History:  Procedure Laterality Date  . BREAST SURGERY    . COLONOSCOPY    . HYSTEROTOMY    . UPPER GI ENDOSCOPY     Family Hx:  History reviewed. No pertinent family history. Social Hx:   Social History  Substance Use Topics  . Smoking status: Former Research scientist (life sciences)  . Smokeless tobacco: Never Used  . Alcohol use No   Medication:    Home Medication:    Current Medication: '@CURMEDTAB'$ @    Allergies:  Asa [aspirin]; Flagyl [metronidazole]; Hydrochlorothiazide; and Sulfa antibiotics  Review of Systems: Gen:  Denies  fever, sweats, chills HEENT: Denies blurred vision, double vision, ear pain, eye pain, hearing loss, nose bleeds, sore throat Cvc:  No dizziness, chest pain or heaviness Resp:   Per notes weak, dyspneic, cough, son Gi: Denies swallowing difficulty, stomach pain, nausea or vomiting, diarrhea, constipation, bowel incontinence Gu:  Denies bladder incontinence, burning urine Ext:   No Joint pain, stiffness or swelling Skin: No skin rash, easy bruising or bleeding or hives Endoc:  No polyuria, polydipsia , polyphagia or weight change Psych: No depression, insomnia or hallucinations  Other:  All other systems negative  Physical Examination:   VS: BP (!) 141/53 (BP Location: Left Arm)   Pulse 92   Temp 97.7 F (36.5 C) (Oral)   Resp 20   Ht '5\' 4"'$  (1.626 m)   Wt 136 lb 3.2 oz (61.8 kg)   SpO2 95%   BMI 23.38 kg/m   General Appearance: No distress  Neuro: without focal findings, mental status, speech normal, alert and oriented, cranial nerves 2-12 intact,  reflexes normal and symmetric, sensation grossly normal  HEENT: PERRLA, EOM intact, no ptosis, no other lesions noticed, Mallampati: Pulmonary:.decrease bs, no wheezing, No rales     Cardiovascular:  Normal S1,S2.  No m/r/g.   Abdomen:Benign, Soft, non-tender, No masses, hepatosplenomegaly, No lymphadenopathy Endoc: No evident thyromegaly, no signs of acromegaly or Cushing features Skin:   warm, no rashes, no ecchymosis  Extremities: normal, no cyanosis, clubbing, no edema, warm with normal capillary refill. Other findings:   Labs results:   Recent Labs     11/07/2016  0732  11/07/16  0138  11/08/16  0513  HGB  11.2*  10.5*  10.5*  HCT  34.5*  32.6*  32.2*  MCV  74.4*  75.3*  73.6*  WBC  7.0  8.5  9.7  BUN  '16  17  19  '$ CREATININE  1.36*  1.37*  1.72*  GLUCOSE  110*  118*  117*  CALCIUM  8.8*   8.1*  7.9*  ,    Rad results:   Dg Chest Port 1 View  Result Date: 11/08/2016 CLINICAL DATA:  Pneumonia. EXAM: PORTABLE CHEST 1 VIEW COMPARISON:  11/07/2016 .  CT 11/10/2016. FINDINGS: Mediastinum is stable. Cardiac pacer in stable position. Heart size stable. Surgical clips noted over the lower chest. Persistent density is noted in the left lung base consistent with treatment changes and/or residual or recurrent tumor. Stable bilateral interstitial prominence. Pneumonitis cannot be excluded. Small bilateral pleural effusions. No pneumothorax. IMPRESSION: 1. Persistent persistent density in the left lung base without interim change. This could represent treatment changes and/or residual/recurrent tumor. Pneumonia cannot be excluded. Stable bilateral interstitial prominence. Pneumonitis cannot be excluded. 2. Cardiac pacer stable position. Stable cardiomegaly . No pulmonary venous congestion. Small bilateral pleural effusions. Electronically Signed   By: Marcello Moores  Register   On: 11/08/2016 06:37   Dg Chest Port 1 View  Result Date: 11/07/2016 CLINICAL DATA:  Hypoxia. Left lower lobe lung cancer treated with radiation therapy in October. EXAM: PORTABLE CHEST 1 VIEW COMPARISON:  09/19/2016 chest radiograph. FINDINGS: Stable configuration of 2 lead right subclavian pacemaker. Fiducial markers overlie the left lower lung. Stable cardiomediastinal silhouette with top-normal heart size and aortic atherosclerosis. No pneumothorax. Stable small left pleural effusion. No right pleural effusion. No pulmonary edema. Patchy consolidation at the left lung base appears slightly worsened since the CT study from 1 day prior. Extensive reticular interstitial opacities at the lung bases appear unchanged. IMPRESSION: 1. Stable small left pleural effusion. 2. Slight worsening of patchy left lung base consolidation, which could represent progression of radiation pneumonitis and/or pneumonia superimposed on tumor. 3. Extensive  underlying bibasilar reticular interstitial opacities, suspect underlying interstitial lung disease. 4. Aortic atherosclerosis. Electronically Signed   By: Ilona Sorrel M.D.   On: 11/07/2016 18:22      Assessment and Plan: Impending respiratory failure, hypoxia left pleural effusion, radiation pneumonitis/ILD, ? Pneumonia, no pulmonary embolism, uti with septic parameters.  -zosyn, zithro -duo neb -solumedrol 100 mg q 8-6 hours for now (? Advancing xrt pneumonitis) -dvt prophylaxis ( on eliquis) -Revisit in am  I have personally obtained a history, examined the patient, evaluated laboratory and imaging results, formulated the assessment and plan and placed orders.  The Patient requires high complexity decision making for assessment and support, frequent evaluation and titration of therapies, application of advanced monitoring technologies and extensive interpretation of multiple databases.   Ozro Russett,M.D. Pulmonary & Critical care Medicine [facility]

## 2016-11-08 NOTE — Progress Notes (Signed)
[place] NOTE  Patient Care Team: Denita Lung, DO as PCP - General (Internal Medicine)  CHIEF COMPLAINTS/PURPOSE OF CONSULTATION: Lung cancer; worsening respiratory distress   HISTORY OF PRESENTING ILLNESS:  Melissa Palmer 83 y.o.  female history of stage I left lower lobe squamous cell lung cancer status post Radiation- finished in November 2017. January/February 2018 patient noted to have worsening chest wall pain shortness of breath a CT scan in February showed significant pneumonitis-like changes left lower lobe. Clinically suspected to have radiation pneumonitis- Which was treated with steroids with significant improvement. Most recently patient was on prednisone 20 mg a day which was discontinued 2-3 days prior to admission because of mental status changes.   Patient was brought to the hospital because of worsening shortness of breath and chest wall pain; and also mental status changes. CT scan of the chest showed worsening infiltrative changes in the left lung. Noncontrast CT of the brain negative for any metastases. Patient was also found to have a UTI on antibiotics. Patient has been on Eliquis during the hospitalization.  Patient clinically was improving in the hospital- however in the last day or so patient noted to have worsening shortness of breath needing 5 L of oxygen. Patient also complained of diffuse pain in the left chest wall. Pulmonary has been consulted. Oncology has been consult for further recommendations.  At this time patient's pain seems to be controlled. She appears frail. Continues to be on oxygen. Coughing. No hemoptysis.  ROS: Poor appetite. Patient prior to coming to the hospital was not on oxygen. However progressive decline was noted in the overall condition. A complete 10 point review of system is done which is negative except mentioned above in history of present illness  MEDICAL HISTORY:  Past Medical History:  Diagnosis Date  .  A-fib ([place])   . Cancer ([place])    lung ca  . Cataract   . Dementia   . Hypertension   . Hypertension   . TIA (transient ischemic attack)     SURGICAL HISTORY: Past Surgical History:  Procedure Laterality Date  . BREAST SURGERY    . COLONOSCOPY    . HYSTEROTOMY    . UPPER GI ENDOSCOPY      SOCIAL HISTORY: Social History   Social History  . Marital status: Married    Spouse name: N/A  . Number of children: N/A  . Years of education: N/A   Occupational History  . Not on file.   Social History Main Topics  . Smoking status: Former Research scientist (life sciences)  . Smokeless tobacco: Never Used  . Alcohol use No  . Drug use: No  . Sexual activity: Not on file   Other Topics Concern  . Not on file   Social History Narrative  . No narrative on file    FAMILY HISTORY: History reviewed. No pertinent family history.  ALLERGIES:  is allergic to asa [aspirin]; flagyl [metronidazole]; hydrochlorothiazide; and sulfa antibiotics.  MEDICATIONS:  Current Facility-Administered Medications  Medication Dose Route Frequency Provider Last Rate Last Dose  . acetaminophen (TYLENOL) tablet 650 mg  650 mg Oral Q6H PRN Demetrios Loll, MD   650 mg at 11/07/16 1751  . ALPRAZolam Duanne Moron) tablet 0.25 mg  0.25 mg Oral BID PRN Hillary Bow, MD   0.25 mg at 11/08/16 0718  . apixaban (ELIQUIS) tablet 2.5 mg  2.5 mg Oral BID Demetrios Loll, MD   2.5 mg at 11/07/16 2217  . cloNIDine (CATAPRES) tablet 0.1 mg  0.1  mg Oral QHS Demetrios Loll, MD   0.1 mg at 11/07/16 2217  . diltiazem (CARDIZEM CD) 24 hr capsule 360 mg  360 mg Oral Daily Demetrios Loll, MD   360 mg at 11/07/16 1010  . docusate sodium (COLACE) capsule 100 mg  100 mg Oral BID Demetrios Loll, MD   100 mg at 11/07/16 2217  . fluvoxaMINE (LUVOX) tablet 25 mg  25 mg Oral Q1200 Demetrios Loll, MD   25 mg at 11/07/16 1318  . guaiFENesin (ROBITUSSIN) 100 MG/5ML solution 100 mg  5 mL Oral Q4H PRN Demetrios Loll, MD      . ipratropium-albuterol (DUONEB) 0.5-2.5 (3) MG/3ML nebulizer solution 3 mL  3  mL Nebulization Q6H Gladstone Lighter, MD   3 mL at 11/08/16 1300  . labetalol (NORMODYNE,TRANDATE) injection 10 mg  10 mg Intravenous Q2H PRN Lance Coon, MD   10 mg at 11/07/16 0016  . lidocaine (LIDODERM) 5 % 1 patch  1 patch Transdermal Q24H Gladstone Lighter, MD   1 patch at 11/08/16 1000  . lisinopril (PRINIVIL,ZESTRIL) tablet 5 mg  5 mg Oral Daily Theodoro Grist, MD   5 mg at 11/07/16 1751  . loratadine (CLARITIN) tablet 10 mg  10 mg Oral Daily Demetrios Loll, MD   10 mg at 11/07/16 1009  . methylPREDNISolone sodium succinate (SOLU-MEDROL) 125 mg/2 mL injection 60 mg  60 mg Intravenous Daily Gladstone Lighter, MD   60 mg at 11/08/16 1000  . ondansetron (ZOFRAN) injection 4 mg  4 mg Intravenous Q6H PRN Demetrios Loll, MD      . oxyCODONE (Oxy IR/ROXICODONE) immediate release tablet 5 mg  5 mg Oral Q6H PRN Hillary Bow, MD   5 mg at 11/08/16 0717  . pantoprazole (PROTONIX) EC tablet 40 mg  40 mg Oral Daily Demetrios Loll, MD   40 mg at 11/07/16 1009  . piperacillin-tazobactam (ZOSYN) IVPB 3.375 g  3.375 g Intravenous Q8H Demetrios Loll, MD   3.375 g at 11/08/16 1817  . polyethylene glycol (MIRALAX / GLYCOLAX) packet 17 g  17 g Oral Daily PRN Srikar Sudini, MD      . senna-docusate (Senokot-S) tablet 1 tablet  1 tablet Oral QHS PRN Demetrios Loll, MD          .  PHYSICAL EXAMINATION:  Vitals:   11/08/16 0953 11/08/16 1427  BP: (!) 101/51 (!) 141/53  Pulse: 99 92  Resp:  20  Temp:  97.7 F (36.5 C)   Filed Weights   11/05/2016 0659 11/05/2016 1311  Weight: 136 lb (61.7 kg) 136 lb 3.2 oz (61.8 kg)    GENERAL: Frail-appearing Caucasian female patient Alert, no distress and comfortable.   Accompanied by multiple family members including her daughter EYES: no pallor or icterus OROPHARYNX: no thrush or ulceration. NECK: supple, no masses felt LYMPH:  no palpable lymphadenopathy in the cervical, axillary or inguinal regions LUNGS: decreased breath sounds to auscultation and no more than right.  HEART/CVS:  regular rate & rhythm and no murmurs; No lower extremity edema ABDOMEN: abdomen soft, non-tender and normal bowel sounds Musculoskeletal:no cyanosis of digits and no clubbing  PSYCH: alert & oriented x 3 with fluent speech NEURO: no focal motor/sensory deficits SKIN:  no rashes or significant lesions  LABORATORY DATA:  I have reviewed the data as listed Lab Results  Component Value Date   WBC 9.7 11/08/2016   HGB 10.5 (L) 11/08/2016   HCT 32.2 (L) 11/08/2016   MCV 73.6 (L) 11/08/2016   PLT 299 11/08/2016  Recent Labs  04/25/16 1539 09/19/16 0934 11/02/2016 0732 11/07/16 0138 11/08/16 0513  NA 135 135 136 135 137  K 4.0 3.7 3.7 3.9 3.2*  CL 104 99* 103 107 104  CO2 '24 26 26 23 27  '$ GLUCOSE 101* 117* 110* 118* 117*  BUN '19 15 16 17 19  '$ CREATININE 1.30* 1.17* 1.36* 1.37* 1.72*  CALCIUM 9.1 9.3 8.8* 8.1* 7.9*  GFRNONAA 36* 41* 34* 34* 26*  GFRAA 42* 47* 40* 39* 30*  PROT 7.6 8.4* 5.9*  --   --   ALBUMIN 3.9 3.7 2.7*  --   --   AST '18 18 19  '$ --   --   ALT 9* 9* 26  --   --   ALKPHOS 66 78 42  --   --   BILITOT 0.3 0.3 0.6  --   --     RADIOGRAPHIC STUDIES: I have personally reviewed the radiological images as listed and agreed with the findings in the report. Ct Head Wo Contrast  Result Date: 10/27/2016 CLINICAL DATA:  Body aches and generalized weakness for 3 months. EXAM: CT HEAD WITHOUT CONTRAST TECHNIQUE: Contiguous axial images were obtained from the base of the skull through the vertex without intravenous contrast. COMPARISON:  Head CT scan 03/08/2016. FINDINGS: Brain: The brain is atrophic with fairly extensive chronic microvascular ischemic change. Remote right frontal infarct is noted. No acute abnormality including infarction, hemorrhage, mass lesion, mass effect, midline shift or abnormal extra-axial fluid collection. No hydrocephalus or pneumocephalus. Vascular: Atherosclerosis noted. Skull: Intact. Sinuses/Orbits: No acute abnormality. Other: None. IMPRESSION: No  acute abnormality. Atrophy, chronic microvascular ischemic change remote right frontal infarct. Atherosclerosis. Electronically Signed   By: Inge Rise M.D.   On: 11/07/2016 11:10   Ct Angio Chest Pe W And/or Wo Contrast  Result Date: 10/23/2016 CLINICAL DATA:  Cough and chest pain for 1 week. Finished radiation therapy in October for lung cancer. EXAM: CT ANGIOGRAPHY CHEST WITH CONTRAST TECHNIQUE: Multidetector CT imaging of the chest was performed using the standard protocol during bolus administration of intravenous contrast. Multiplanar CT image reconstructions and MIPs were obtained to evaluate the vascular anatomy. CONTRAST:  60 cc of Isovue 370 COMPARISON:  09/26/2016 FINDINGS: Cardiovascular: The quality of this exam for evaluation of pulmonary embolism is good. The only limitation is minimal motion inferiorly. No evidence of pulmonary embolism. Extensive colonic diverticulosis. Ulcerative plaque in the transverse aorta. Mild cardiomegaly, without pericardial effusion. Multivessel coronary artery atherosclerosis. Mediastinum/Nodes: A node within the azygoesophageal recess measures 12 mm on image 45/series 4 versus maximally 7 mm on the prior exam. No hilar adenopathy. Lungs/Pleura: Slight increase in small left pleural effusion. Trace right pleural fluid or thickening. Moderate centrilobular emphysema. 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 Basilar predominant subpleural reticulation with architectural distortion. Left lower lobe consolidation with only peripheral air bronchograms, increased. Underlying fiducials. Upper Abdomen: Normal imaged portions of the liver, spleen, stomach, pancreas, adrenal glands, kidneys. Musculoskeletal: Diminutive right fifth rib is likely developmental. Review of the MIP images confirms the above findings. IMPRESSION: 1.  No evidence of pulmonary embolism.  Minimal motion degradation. 2. Increase in left lower lobe consolidation with slight  increase in small left pleural effusion. Increased consolidation could be due to evolving radiation change. Residual/recurrent disease cannot be excluded. 3. Developing adenopathy within the azygoesophageal recess station of the mediastinum. Suspicious for nodal metastasis. Reactive adenopathy could look similar. This could be re-evaluated with short term follow-up CT or further characterized with PET. 4.  Coronary artery atherosclerosis. Aortic atherosclerosis. 5. 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 Electronically Signed   By: Abigail Miyamoto M.D.   On: 10/30/2016 09:22   Dg Chest Port 1 View  Result Date: 11/08/2016 CLINICAL DATA:  Pneumonia. EXAM: PORTABLE CHEST 1 VIEW COMPARISON:  11/07/2016 .  CT 10/31/2016. FINDINGS: Mediastinum is stable. Cardiac pacer in stable position. Heart size stable. Surgical clips noted over the lower chest. Persistent density is noted in the left lung base consistent with treatment changes and/or residual or recurrent tumor. Stable bilateral interstitial prominence. Pneumonitis cannot be excluded. Small bilateral pleural effusions. No pneumothorax. IMPRESSION: 1. Persistent persistent density in the left lung base without interim change. This could represent treatment changes and/or residual/recurrent tumor. Pneumonia cannot be excluded. Stable bilateral interstitial prominence. Pneumonitis cannot be excluded. 2. Cardiac pacer stable position. Stable cardiomegaly . No pulmonary venous congestion. Small bilateral pleural effusions. Electronically Signed   By: Marcello Moores  Register   On: 11/08/2016 06:37   Dg Chest Port 1 View  Result Date: 11/07/2016 CLINICAL DATA:  Hypoxia. Left lower lobe lung cancer treated with radiation therapy in October. EXAM: PORTABLE CHEST 1 VIEW COMPARISON:  09/19/2016 chest radiograph. FINDINGS: Stable configuration of 2 lead right subclavian pacemaker. Fiducial markers overlie the left lower lung. Stable cardiomediastinal  silhouette with top-normal heart size and aortic atherosclerosis. No pneumothorax. Stable small left pleural effusion. No right pleural effusion. No pulmonary edema. Patchy consolidation at the left lung base appears slightly worsened since the CT study from 1 day prior. Extensive reticular interstitial opacities at the lung bases appear unchanged. IMPRESSION: 1. Stable small left pleural effusion. 2. Slight worsening of patchy left lung base consolidation, which could represent progression of radiation pneumonitis and/or pneumonia superimposed on tumor. 3. Extensive underlying bibasilar reticular interstitial opacities, suspect underlying interstitial lung disease. 4. Aortic atherosclerosis. Electronically Signed   By: Ilona Sorrel M.D.   On: 11/07/2016 18:22    ASSESSMENT & PLAN:   # 83 year old female patient with history of stage I squamous cell lung cancer status post radiation [finished November 2017] Is currently admitted the hospital for worsening shortness of breath cough chest wall pain.  # Acute respiratory failure- needing 5 L of oxygen. CT scan- increasing left lower lobe consolidation/ also left upper lobe involvement. Differential diagnosis includes radiation pneumonitis versus recurrence of malignancy versus infectious pneumonitis [less likely] . Patient is a poor candidate for any aggressive Diagnostic procedures at this time. Agree with high-dose of steroids; continue antibiotics. Appreciate pulmonary recommendations. We will discuss with Dr.Fleming; also discuss with Dr.Crystal.   # Chronic anemia likely secondary to IDA/anemia of chronic disease. Hemoglobin 10.5. Stable.  # AKi/ CKD creatinine baseline on 1.2-1.3 today 1.7.   # Debility/ mild dementia/ overall guarded prognosis. Agree with DNR/DNI. Discussed with the patient's daughter at length.  Thank you Dr. Tressia Miners for allowing me to participate in the care of your pleasant patient. Please do not hesitate to contact me with  questions or concerns in the interim. All questions were answered. The patient's daughter knows to call the clinic with any problems, questions or concerns.    Cammie Sickle, MD 11/08/2016 7:41 PM

## 2016-11-08 NOTE — Progress Notes (Signed)
Pt became increasingly uncomfortable during shift and was reassured about her pain medication that she was somewhat anxious about. Pt agrees to try the oxyCODONE, will also provide prn ALPRAZolam for associated anxiety.  Pt received '5mg'$  oxyCODONE and 0.25 ALPRAZolam approximately 0145 with good initial relief but awoke about 0330 with same pain and intensity. Nebulizer tx also given with some minimal improvement.  Pt reports pain type, location, and feeling is the same as it has been, just severe.  MD notified and order received for 0.'5mg'$  hydromorphone q 2hr prn.  Pt is educated about this medication and is amenable to taking this medication.    Pt now sleeping comfortably and appears much more comfortable.  Will continue to monitor pain and anxiety closely.

## 2016-11-09 DIAGNOSIS — D509 Iron deficiency anemia, unspecified: Secondary | ICD-10-CM

## 2016-11-09 DIAGNOSIS — Z923 Personal history of irradiation: Secondary | ICD-10-CM

## 2016-11-09 DIAGNOSIS — J90 Pleural effusion, not elsewhere classified: Secondary | ICD-10-CM

## 2016-11-09 DIAGNOSIS — Z9981 Dependence on supplemental oxygen: Secondary | ICD-10-CM

## 2016-11-09 DIAGNOSIS — I10 Essential (primary) hypertension: Secondary | ICD-10-CM

## 2016-11-09 LAB — GLUCOSE, CAPILLARY: Glucose-Capillary: 175 mg/dL — ABNORMAL HIGH (ref 65–99)

## 2016-11-09 MED ORDER — AZITHROMYCIN 500 MG PO TABS
500.0000 mg | ORAL_TABLET | Freq: Every day | ORAL | Status: DC
  Administered 2016-11-09 – 2016-11-13 (×5): 500 mg via ORAL
  Filled 2016-11-09 (×5): qty 1

## 2016-11-09 MED ORDER — METHYLPREDNISOLONE SODIUM SUCC 125 MG IJ SOLR
60.0000 mg | Freq: Three times a day (TID) | INTRAMUSCULAR | Status: DC
  Administered 2016-11-09 – 2016-11-13 (×12): 60 mg via INTRAVENOUS
  Filled 2016-11-09 (×12): qty 2

## 2016-11-09 MED ORDER — LISINOPRIL 10 MG PO TABS
10.0000 mg | ORAL_TABLET | Freq: Every day | ORAL | Status: DC
  Administered 2016-11-09: 10 mg via ORAL
  Filled 2016-11-09: qty 1

## 2016-11-09 MED ORDER — LISINOPRIL 10 MG PO TABS
10.0000 mg | ORAL_TABLET | Freq: Two times a day (BID) | ORAL | Status: DC
  Administered 2016-11-09 – 2016-11-13 (×9): 10 mg via ORAL
  Filled 2016-11-09 (×9): qty 1

## 2016-11-09 MED ORDER — POTASSIUM CHLORIDE 20 MEQ PO PACK
40.0000 meq | PACK | Freq: Once | ORAL | Status: AC
  Administered 2016-11-09: 09:00:00 40 meq via ORAL
  Filled 2016-11-09: qty 2

## 2016-11-09 MED ORDER — HYDROMORPHONE HCL 1 MG/ML IJ SOLN
0.5000 mg | Freq: Once | INTRAMUSCULAR | Status: AC
  Administered 2016-11-09: 0.5 mg via INTRAVENOUS
  Filled 2016-11-09: qty 0.5

## 2016-11-09 NOTE — Evaluation (Signed)
Physical Therapy Evaluation Patient Details Name: Melissa Palmer MRN: [id number] DOB: 1928/05/31 Today's Date: 11/09/2016   History of Present Illness  Pt is an 83 y.o. female presenting to hospital with body pain (pt with h/o radiation therapy for spot on lungs).  Pt admitted with community acquired PNA, UTI, and acute hypoxic respiratory failure.  PMH includes L upper lung mass, CA, dementia, htn, TIA, a-fib, pacemaker, chronic L chest pain d/t lung CA.  Clinical Impression  Prior to hospital admission, pt was requiring assist for ambulation the past couple weeks (but was able to perform household ambulation without assist prior to that).  Pt lives with her daughter in 1 level home with stairs to enter.  Currently pt is CGA supine to sit with HOB elevated.  Upon pt sitting up on edge of bed pt noted to have SOB and O2 sats 73% on 5 L/min O2 via nasal cannula.  Using 2 person assist, pt assisted back into bed, repositioned, and given vc's for pursed lip breathing.  Pt's O2 increased back up to 88% but unable to increase pt's O2 past 88% on 5 L.  Then suddenly pt's O2 started to decrease (ranging 79-85% on 5 L).  Nursing was notified immediately regarding oxygen concerns and nursing immediately came to assess pt and non-rebreather placed on pt (O2 sats increased back into 90's) and respiratory came (pt now changed to HFNC).  Pt would benefit from skilled PT to address noted impairments and functional limitations.  Recommend pt discharge to STR when medically appropriate.     Follow Up Recommendations SNF    Equipment Recommendations  Rolling walker with 5" wheels    Recommendations for Other Services       Precautions / Restrictions Precautions Precautions: Fall Restrictions Weight Bearing Restrictions: No      Mobility  Bed Mobility Overal bed mobility: Needs Assistance Bed Mobility: Supine to Sit; Sit to Supine     Supine to sit: Min guard;HOB elevated  Sit to supine: Total assist  x2 (d/t respiratory status concerns)   General bed mobility comments: mild increased effort to perform supine to sit  Transfers                 General transfer comment: Deferred d/t O2 desaturation upon sitting up  Ambulation/Gait                Stairs            Wheelchair Mobility    Modified Rankin (Stroke Patients Only)       Balance Overall balance assessment: Needs assistance Sitting-balance support: Bilateral upper extremity supported;Feet supported Sitting balance-Leahy Scale: Fair Sitting balance - Comments: static sitting                                     Pertinent Vitals/Pain  See above for details.    Home Living Family/patient expects to be discharged to:: Private residence Living Arrangements: Children (Pt's daughter) Available Help at Discharge: Family;Available PRN/intermittently Type of Home: House Home Access: Stairs to enter Entrance Stairs-Rails: Left Entrance Stairs-Number of Steps: 3 Home Layout: One level Home Equipment: Walker - 4 wheels;Cane - single point;Walker - 2 wheels;Bedside commode;Shower seat;Grab bars - tub/shower      Prior Function Level of Independence: Needs assistance   Gait / Transfers Assistance Needed: Prior to last 2 weeks, pt was ambulatory within home without assist; pt has required increased  assist last 2 weeks with ambulation.  Pt's grandson reports 3 "slow motion" falls (pt sitting down slowly towards floor) in past 6 months.     Comments: Pt's grandson reports pt does not use any walker d/t fear of it falling on top of her.  No home O2.     Hand Dominance        Extremity/Trunk Assessment   Upper Extremity Assessment Upper Extremity Assessment: Generalized weakness    Lower Extremity Assessment Lower Extremity Assessment: Generalized weakness       Communication   Communication: No difficulties  Cognition Arousal/Alertness: Awake/alert Behavior During Therapy:  WFL for tasks assessed/performed Overall Cognitive Status: History of cognitive impairments - at baseline (Oriented to person)                                        General Comments General comments (skin integrity, edema, etc.): Pt's grandson present during session.  Nursing cleared pt for participation in physical therapy.  Pt agreeable to PT session.    Exercises     Assessment/Plan    PT Assessment Patient needs continued PT services  PT Problem List Decreased strength;Decreased activity tolerance;Decreased balance;Decreased mobility;Decreased knowledge of use of DME;Decreased safety awareness;Decreased knowledge of precautions;Cardiopulmonary status limiting activity;Pain       PT Treatment Interventions DME instruction;Gait training;Stair training;Functional mobility training;Therapeutic activities;Therapeutic exercise;Balance training;Patient/family education    PT Goals (Current goals can be found in the Care Plan section)  Acute Rehab PT Goals Patient Stated Goal: to be able to breathe better with activity PT Goal Formulation: With patient Time For Goal Achievement: 11/23/16 Potential to Achieve Goals: Fair    Frequency Min 2X/week   Barriers to discharge Decreased caregiver support      Co-evaluation               End of Session Equipment Utilized During Treatment: Oxygen (5 L O2 via nasal cannula) Activity Tolerance: Treatment limited secondary to medical complications (Comment) (O2 desaturation with activity) Patient left: in bed;with call bell/phone within reach;with bed alarm set;with nursing/sitter in room;with family/visitor present (respiratory present) Nurse Communication: Mobility status;Precautions (O2 desaturation with activity) PT Visit Diagnosis: Muscle weakness (generalized) (M62.81);Difficulty in walking, not elsewhere classified (R26.2)    Time: 4010-2725 PT Time Calculation (min) (ACUTE ONLY): 25 min   Charges:   PT  Evaluation $PT Eval Low Complexity: 1 Procedure     PT G CodesLeitha Bleak, PT 11/09/16, 5:56 PM [phone]

## 2016-11-09 NOTE — Progress Notes (Signed)
   Notified Dr. Olin Pia. He will review telemetry today. Further recs pending.

## 2016-11-09 NOTE — Plan of Care (Signed)
Problem: Respiratory: Goal: Respiratory status will improve Outcome: Not Progressing Pt continues to require 5L 02 with 02 stats staying around 93% Goal: Pain level will decrease Outcome: Not Progressing Pt c/o  Left chest pain, lidocaine patch applied.

## 2016-11-09 NOTE — Progress Notes (Signed)
Pt's 02 stats dropped to 73% on 5L when sat up on the side of the bed with physical therapy. This RN called to room and NRB applied. Respiratory called and patient is now on HFNC at 30 L/min with 02 at 96%. MD made aware. Ammie Dalton, RN

## 2016-11-09 NOTE — Progress Notes (Signed)
[facility] NAME: Melissa Palmer    MR#:  [id number]  DATE OF BIRTH:  [birth year]  SUBJECTIVE:  CHIEF COMPLAINT:   Chief Complaint  Patient presents with  . Muscle Pain   -more alert today, still remains hypoxic- on 5L o2 - complains of chest pain with pleuritic component  REVIEW OF SYSTEMS:  Review of Systems  Constitutional: Positive for malaise/fatigue. Negative for chills and fever.  HENT: Negative for congestion, ear discharge, hearing loss and nosebleeds.   Eyes: Negative for blurred vision and double vision.  Respiratory: Positive for cough and shortness of breath. Negative for sputum production.   Cardiovascular: Positive for chest pain. Negative for leg swelling.  Gastrointestinal: Negative for abdominal pain, constipation, diarrhea, heartburn and vomiting.  Genitourinary: Negative for dysuria.  Musculoskeletal: Positive for back pain and myalgias.  Neurological: Positive for weakness. Negative for dizziness, sensory change, speech change, focal weakness and seizures.  Psychiatric/Behavioral: Negative for depression.    DRUG ALLERGIES:   Allergies  Allergen Reactions  . Asa [Aspirin] Anaphylaxis  . Flagyl [Metronidazole] Itching  . Hydrochlorothiazide Itching  . Sulfa Antibiotics Itching    VITALS:  Blood pressure (!) 150/68, pulse (!) 113, temperature 98.4 F (36.9 C), temperature source Oral, resp. rate 18, height '5\' 4"'$  (1.626 m), weight 61.8 kg (136 lb 3.2 oz), SpO2 96 %.  PHYSICAL EXAMINATION:  Physical Exam  GENERAL:  83 y.o.-year-old patient lying in the bed with no acute distress.  EYES: Pupils equal, round, reactive to light and accommodation. No scleral icterus. Extraocular muscles intact.  HEENT: Head atraumatic, normocephalic. Oropharynx and nasopharynx clear.  NECK:  Supple, no jugular venous distention. No thyroid enlargement, no tenderness.  LUNGS: Normal breath sounds bilaterally, no  wheezing. Bibasilar crackles noted.  No use of accessory muscles of respiration. decreased breath sounds at the bases CARDIOVASCULAR: S1, S2 normal. No  rubs, or gallops. 3/6 systolic murmur present ABDOMEN: Soft, nontender, nondistended. Bowel sounds present. No organomegaly or mass.  EXTREMITIES: No pedal edema, cyanosis, or clubbing.  NEUROLOGIC: Cranial nerves II through XII are intact. Muscle strength 5/5 in all extremities. Sensation intact. Gait not checked. Global weakness noted. PSYCHIATRIC: The patient is alert and oriented x 2-3.  SKIN: No obvious rash, lesion, or ulcer.    LABORATORY PANEL:   CBC  Recent Labs Lab 11/08/16 0513  WBC 9.7  HGB 10.5*  HCT 32.2*  PLT 299   ------------------------------------------------------------------------------------------------------------------  Chemistries   Recent Labs Lab 11/09/2016 0732  11/08/16 0513  NA 136  < > 137  K 3.7  < > 3.2*  CL 103  < > 104  CO2 26  < > 27  GLUCOSE 110*  < > 117*  BUN 16  < > 19  CREATININE 1.36*  < > 1.72*  CALCIUM 8.8*  < > 7.9*  AST 19  --   --   ALT 26  --   --   ALKPHOS 42  --   --   BILITOT 0.6  --   --   < > = values in this interval not displayed. ------------------------------------------------------------------------------------------------------------------  Cardiac Enzymes  Recent Labs Lab 11/08/16 0513  TROPONINI 0.03*   ------------------------------------------------------------------------------------------------------------------  RADIOLOGY:  Dg Chest Port 1 View  Result Date: 11/08/2016 CLINICAL DATA:  Pneumonia. EXAM: PORTABLE CHEST 1 VIEW COMPARISON:  11/07/2016 .  CT 10/26/2016. FINDINGS: Mediastinum is stable. Cardiac pacer in stable position. Heart size stable. Surgical clips noted over the lower  chest. Persistent density is noted in the left lung base consistent with treatment changes and/or residual or recurrent tumor. Stable bilateral interstitial  prominence. Pneumonitis cannot be excluded. Small bilateral pleural effusions. No pneumothorax. IMPRESSION: 1. Persistent persistent density in the left lung base without interim change. This could represent treatment changes and/or residual/recurrent tumor. Pneumonia cannot be excluded. Stable bilateral interstitial prominence. Pneumonitis cannot be excluded. 2. Cardiac pacer stable position. Stable cardiomegaly . No pulmonary venous congestion. Small bilateral pleural effusions. Electronically Signed   By: Marcello Moores  Register   On: 11/08/2016 06:37   Dg Chest Port 1 View  Result Date: 11/07/2016 CLINICAL DATA:  Hypoxia. Left lower lobe lung cancer treated with radiation therapy in October. EXAM: PORTABLE CHEST 1 VIEW COMPARISON:  09/19/2016 chest radiograph. FINDINGS: Stable configuration of 2 lead right subclavian pacemaker. Fiducial markers overlie the left lower lung. Stable cardiomediastinal silhouette with top-normal heart size and aortic atherosclerosis. No pneumothorax. Stable small left pleural effusion. No right pleural effusion. No pulmonary edema. Patchy consolidation at the left lung base appears slightly worsened since the CT study from 1 day prior. Extensive reticular interstitial opacities at the lung bases appear unchanged. IMPRESSION: 1. Stable small left pleural effusion. 2. Slight worsening of patchy left lung base consolidation, which could represent progression of radiation pneumonitis and/or pneumonia superimposed on tumor. 3. Extensive underlying bibasilar reticular interstitial opacities, suspect underlying interstitial lung disease. 4. Aortic atherosclerosis. Electronically Signed   By: Ilona Sorrel M.D.   On: 11/07/2016 18:22    EKG:   Orders placed or performed during the hospital encounter of 10/17/2016  . ED EKG  . ED EKG  . EKG 12-Lead  . EKG 12-Lead  . EKG 12-Lead  . 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  #1 acute hypoxic respiratory failure- Not on home oxygen, currently on 5 L oxygen. -Acute decompensation after physical therapy, started on high flow nasal cannula. -Secondary to radiation pneumonitis. -Pulmonary consulted. Nebs as needed. High-dose steroids added. - possible PNA cannot be excluded- so continue zosyn.  - once pain is improved, encourage incentive spirometry  #2 Sepsis- UTI and PNA - on zosyn, f/u cultures Fevers are better today  #2 left-sided chest pain-secondary to possible radiation pneumonitis. CT of the chest on admission 2 days ago with no further pneumonia or pulmonary embolism noted. Repeat chest x-rays in the last 2 days without any acute changes. -Troponins have remained negative. -Continue pain medications as needed. Lidoderm patch place. Try to decrease severe narcotics as causing her to be sedated and also due to her age.  #3 history of atrial fibrillation-rate controlled, on Cardizem orally. -On eliquis for anticoagulation  #4 left lung cancer-squamous cell carcinoma of left lung base, status post radiation, finished in 2017 October. -Currently not on any chemotherapy actively. Oncology consulted  #5 hypokalemia- replaced  #6 DVT prophylaxis-patient already on eliquis  #7 hypertension-On clonidine and lisinopril and Cardizem   Physical Therapy consulted.   All the records are reviewed and case discussed with Care Management/Social Workerr. Management plans discussed with the patient, family and they are in agreement.  CODE STATUS: DNR  TOTAL TIME TAKING CARE OF THIS PATIENT: 34 minutes.   POSSIBLE D/C IN 2-3 DAYS,  DEPENDING ON CLINICAL CONDITION.   Lysha Schrade M.D on 11/09/2016 at 1:40 PM  Between  7am to 6pm - Pager - [phone]  After 6pm go to [email or web address] - password [email or web address] [facility]  Office  [phone]  CC: Primary care physician; Denita Lung, DO

## 2016-11-09 NOTE — Evaluation (Signed)
Clinical/Bedside Swallow Evaluation Patient Details  Name: Melissa Palmer MRN: [id number] Date of Birth: 04-Mar-1928  Today's Date: 11/09/2016 Time: SLP Start Time (ACUTE ONLY): 1030 SLP Stop Time (ACUTE ONLY): 1130 SLP Time Calculation (min) (ACUTE ONLY): 60 min  Past Medical History:  Past Medical History:  Diagnosis Date  . A-fib ([place])   . Cancer ([place])    lung ca  . Cataract   . Dementia   . Hypertension   . Hypertension   . TIA (transient ischemic attack)    Past Surgical History:  Past Surgical History:  Procedure Laterality Date  . BREAST SURGERY    . COLONOSCOPY    . HYSTEROTOMY    . UPPER GI ENDOSCOPY     HPI:  Pt  is a 83 y.o. female with a known history of Lung cancer, status post radiation, A. fib, status post pacemaker, HTN, dementia, hypertension, CVA and TIA. The patient presented to the ED with chest pain on the left side in radiation area. She has a history of lung cancer on the left side.  She has not had radiation since last October. She denies any fever or chills but has shortness of breath and cough. CT angiogram of the chest didn't show any PE but left-sided consolidation and possible pneumonia. Pt can get easily choked if not careful; this occurred when drinking Potassium and OJ w/ NSG this morning. Per report, this has occurred w/ water drinking as well but noted pt is drinking using STRAWS in room.    Assessment / Plan / Recommendation Clinical Impression  Pt appears to adequately toleration trials of thin liquids VIA CUP w/ no overt s/s of aspiration noted; no decline in respiratory status and clear vocal quality noted post trials. Pt did exhibit frequent belching post trials - the belching/pressure can impact motility of liquid/food trials being swallowed. Oral phase grossly wfl w/ all trials except min increased time for mastication of solids d/t edentulous status -  trials were broke down/moistened the trials for easier mastication. Pt cleared appropriately  b/t trials. Suspect pt's swallowing could be impacted by dysmotility d/t belching/burping as well as solid foods not being well-masticated and broken down b/f being swallowed. Pt also has a baseline dx of Dementia, Cognitive decline, which can impact overall swallowing awareness. Pt appeared to tolerate trials of thin liquids via cup w/ softened foods following general aspiration precautions and feeding self slowly, clearing her mouth b/t bites/sips. Recommend a Dysphagia level 3 diet w/ well-chopped meats moistened; thin liquids. Supervision and moniotring at meals for follow through w/ aspiration precautions; Pills given in Puree.  SLP Visit Diagnosis: Dysphagia, oropharyngeal phase (R13.12)    Aspiration Risk   (reduced following aspiration precautions; monitoring)    Diet Recommendation  Dysphagia level 3 w/ minced/cut meats, moistened well; Thin liquids VIA CUP - NO Straws. General aspiration and Reflux precautions; monitoring during meals for follow through w/ precautions d/t Cognitive decline  Medication Administration: Whole meds with puree    Other  Recommendations Recommended Consults: Consider GI evaluation (Dietician) Oral Care Recommendations: Oral care BID;Staff/trained caregiver to provide oral care;Patient independent with oral care   Follow up Recommendations None      Frequency and Duration min 2x/week  1 week       Prognosis Prognosis for Safe Diet Advancement: Good Barriers to Reach Goals: Cognitive deficits;Severity of deficits      Swallow Study   General Date of Onset: 10/24/2016 HPI: Pt  is a 83 y.o. female with a  known history of Lung cancer, status post radiation, A. fib, status post pacemaker, HTN, dementia, hypertension, CVA and TIA. The patient presented to the ED with chest pain on the left side in radiation area. She has a history of lung cancer on the left side.  She has not had radiation since last October. She denies any fever or chills but has shortness of  breath and cough. CT angiogram of the chest didn't show any PE but left-sided consolidation and possible pneumonia. Pt can get easily choked if not careful; this occurred when drinking Potassium and OJ w/ NSG this morning. Per report, this has occurred w/ water drinking as well but noted pt is drinking using STRAWS in room.  Type of Study: Bedside Swallow Evaluation Previous Swallow Assessment: none indicated Diet Prior to this Study: Regular;Thin liquids Temperature Spikes Noted: No (wbc 9.7) Respiratory Status: Room air History of Recent Intubation: No Behavior/Cognition: Alert;Cooperative;Pleasant mood;Confused;Distractible;Requires cueing Oral Cavity Assessment: Within Functional Limits Oral Care Completed by SLP: Recent completion by staff Oral Cavity - Dentition: Edentulous (does not wear ) Vision: Functional for self-feeding Self-Feeding Abilities: Able to feed self;Needs assist;Needs set up Patient Positioning: Upright in bed Baseline Vocal Quality: Normal Volitional Cough: Strong Volitional Swallow: Able to elicit    Oral/Motor/Sensory Function Overall Oral Motor/Sensory Function: Within functional limits   Ice Chips Ice chips: Within functional limits Presentation: Spoon;Self Fed (2 trials)   Thin Liquid Thin Liquid: Within functional limits Presentation: Cup;Self Fed (8 trials) Other Comments: min shaky in UEs    Nectar Thick Nectar Thick Liquid: Not tested   Honey Thick Honey Thick Liquid: Not tested   Puree Puree: Within functional limits Presentation: Self Fed;Spoon (6 trials)   Solid   GO   Solid: Impaired Presentation: Spoon;Self Fed (3 trials) Oral Phase Impairments: Impaired mastication (min increased time for mastication d/t lacking dentition) Pharyngeal Phase Impairments:  (none) Other Comments: broke down/moistened the trials for easier mastication         Orinda Kenner, MS, CCC-SLP Diannah Rindfleisch 11/09/2016,2:15 PM

## 2016-11-09 NOTE — Progress Notes (Signed)
[place] NOTE  Patient Care Team: Denita Lung, DO as PCP - General (Internal Medicine)  CHIEF COMPLAINTS/PURPOSE OF CONSULTATION: Lung cancer; worsening respiratory distress   CC: The patient had a better night; left chest wall pain improved. However this morning she is mildly confused as per the grandson. However she is currently up in the bed to have her breakfast. Yolanda Bonine feels that patient felt better after getting breathing treatments. She continues to be on 5 L of oxygen.    MEDICAL HISTORY:  Past Medical History:  Diagnosis Date  . A-fib ([place])   . Cancer ([place])    lung ca  . Cataract   . Dementia   . Hypertension   . Hypertension   . TIA (transient ischemic attack)     SURGICAL HISTORY: Past Surgical History:  Procedure Laterality Date  . BREAST SURGERY    . COLONOSCOPY    . HYSTEROTOMY    . UPPER GI ENDOSCOPY      SOCIAL HISTORY: Social History   Social History  . Marital status: Married    Spouse name: N/A  . Number of children: N/A  . Years of education: N/A   Occupational History  . Not on file.   Social History Main Topics  . Smoking status: Former Research scientist (life sciences)  . Smokeless tobacco: Never Used  . Alcohol use No  . Drug use: No  . Sexual activity: Not on file   Other Topics Concern  . Not on file   Social History Narrative  . No narrative on file    FAMILY HISTORY: History reviewed. No pertinent family history.  ALLERGIES:  is allergic to asa [aspirin]; flagyl [metronidazole]; hydrochlorothiazide; and sulfa antibiotics.  MEDICATIONS:  Current Facility-Administered Medications  Medication Dose Route Frequency Provider Last Rate Last Dose  . acetaminophen (TYLENOL) tablet 650 mg  650 mg Oral Q6H PRN Demetrios Loll, MD   650 mg at 11/07/16 1751  . ALPRAZolam Duanne Moron) tablet 0.25 mg  0.25 mg Oral BID PRN Hillary Bow, MD   0.25 mg at 11/09/16 0349  . apixaban (ELIQUIS) tablet 2.5 mg  2.5 mg Oral BID Demetrios Loll, MD   2.5 mg at  11/09/16 0951  . azithromycin (ZITHROMAX) tablet 500 mg  500 mg Oral Daily Gladstone Lighter, MD      . cloNIDine (CATAPRES) tablet 0.1 mg  0.1 mg Oral QHS Demetrios Loll, MD   0.1 mg at 11/08/16 2134  . diltiazem (CARDIZEM CD) 24 hr capsule 360 mg  360 mg Oral Daily Demetrios Loll, MD   360 mg at 11/09/16 0951  . docusate sodium (COLACE) capsule 100 mg  100 mg Oral BID Demetrios Loll, MD   100 mg at 11/09/16 0951  . fluvoxaMINE (LUVOX) tablet 25 mg  25 mg Oral Q1200 Demetrios Loll, MD   25 mg at 11/07/16 1318  . guaiFENesin (ROBITUSSIN) 100 MG/5ML solution 100 mg  5 mL Oral Q4H PRN Demetrios Loll, MD      . ipratropium-albuterol (DUONEB) 0.5-2.5 (3) MG/3ML nebulizer solution 3 mL  3 mL Nebulization Q6H Gladstone Lighter, MD   3 mL at 11/09/16 1218  . labetalol (NORMODYNE,TRANDATE) injection 10 mg  10 mg Intravenous Q2H PRN Lance Coon, MD   10 mg at 11/07/16 0016  . lidocaine (LIDODERM) 5 % 1 patch  1 patch Transdermal Q24H Gladstone Lighter, MD   1 patch at 11/09/16 0954  . lisinopril (PRINIVIL,ZESTRIL) tablet 10 mg  10 mg Oral BID Gladstone Lighter, MD      .  loratadine (CLARITIN) tablet 10 mg  10 mg Oral Daily Demetrios Loll, MD   10 mg at 11/09/16 0951  . methylPREDNISolone sodium succinate (SOLU-MEDROL) 125 mg/2 mL injection 60 mg  60 mg Intravenous Q8H Gladstone Lighter, MD   60 mg at 11/09/16 1323  . ondansetron (ZOFRAN) injection 4 mg  4 mg Intravenous Q6H PRN Demetrios Loll, MD      . oxyCODONE (Oxy IR/ROXICODONE) immediate release tablet 5 mg  5 mg Oral Q6H PRN Hillary Bow, MD   5 mg at 11/09/16 1219  . pantoprazole (PROTONIX) EC tablet 40 mg  40 mg Oral Daily Demetrios Loll, MD   40 mg at 11/09/16 0951  . piperacillin-tazobactam (ZOSYN) IVPB 3.375 g  3.375 g Intravenous Q8H Demetrios Loll, MD   3.375 g at 11/09/16 0950  . polyethylene glycol (MIRALAX / GLYCOLAX) packet 17 g  17 g Oral Daily PRN Srikar Sudini, MD      . senna-docusate (Senokot-S) tablet 1 tablet  1 tablet Oral QHS PRN Demetrios Loll, MD          .  PHYSICAL  EXAMINATION:  Vitals:   11/09/16 0952 11/09/16 1342  BP: (!) 150/68 (!) 158/62  Pulse: (!) 113 (!) 123  Resp: 18 18  Temp: 98.4 F (36.9 C) 98.4 F (36.9 C)   Filed Weights   11/07/2016 0659 10/19/2016 1311  Weight: 136 lb (61.7 kg) 136 lb 3.2 oz (61.8 kg)    GENERAL: Frail-appearing Caucasian female patient Alert, no distress and comfortable.   Accompanied by her grandson. On 5 lits O2.  EYES: no pallor or icterus OROPHARYNX: no thrush or ulceration. NECK: supple, no masses felt LYMPH:  no palpable lymphadenopathy in the cervical, axillary or inguinal regions LUNGS: decreased breath sounds to auscultation and no more than right.  HEART/CVS: regular rate & rhythm and no murmurs; No lower extremity edema ABDOMEN: abdomen soft, non-tender and normal bowel sounds Musculoskeletal:no cyanosis of digits and no clubbing  PSYCH: alert & oriented x 3 with fluent speech NEURO: no focal motor/sensory deficits SKIN:  no rashes or significant lesions  LABORATORY DATA:  I have reviewed the data as listed Lab Results  Component Value Date   WBC 9.7 11/08/2016   HGB 10.5 (L) 11/08/2016   HCT 32.2 (L) 11/08/2016   MCV 73.6 (L) 11/08/2016   PLT 299 11/08/2016    Recent Labs  04/25/16 1539 09/19/16 0934 11/07/2016 0732 11/07/16 0138 11/08/16 0513  NA 135 135 136 135 137  K 4.0 3.7 3.7 3.9 3.2*  CL 104 99* 103 107 104  CO2 '24 26 26 23 27  '$ GLUCOSE 101* 117* 110* 118* 117*  BUN '19 15 16 17 19  '$ CREATININE 1.30* 1.17* 1.36* 1.37* 1.72*  CALCIUM 9.1 9.3 8.8* 8.1* 7.9*  GFRNONAA 36* 41* 34* 34* 26*  GFRAA 42* 47* 40* 39* 30*  PROT 7.6 8.4* 5.9*  --   --   ALBUMIN 3.9 3.7 2.7*  --   --   AST '18 18 19  '$ --   --   ALT 9* 9* 26  --   --   ALKPHOS 66 78 42  --   --   BILITOT 0.3 0.3 0.6  --   --     RADIOGRAPHIC STUDIES: I have personally reviewed the radiological images as listed and agreed with the findings in the report. Ct Head Wo Contrast  Result Date: 10/18/2016 CLINICAL DATA:   Body aches and generalized weakness for 3 months. EXAM: CT  HEAD WITHOUT CONTRAST TECHNIQUE: Contiguous axial images were obtained from the base of the skull through the vertex without intravenous contrast. COMPARISON:  Head CT scan 03/08/2016. FINDINGS: Brain: The brain is atrophic with fairly extensive chronic microvascular ischemic change. Remote right frontal infarct is noted. No acute abnormality including infarction, hemorrhage, mass lesion, mass effect, midline shift or abnormal extra-axial fluid collection. No hydrocephalus or pneumocephalus. Vascular: Atherosclerosis noted. Skull: Intact. Sinuses/Orbits: No acute abnormality. Other: None. IMPRESSION: No acute abnormality. Atrophy, chronic microvascular ischemic change remote right frontal infarct. Atherosclerosis. Electronically Signed   By: Inge Rise M.D.   On: 11/05/2016 11:10   Ct Angio Chest Pe W And/or Wo Contrast  Result Date: 10/27/2016 CLINICAL DATA:  Cough and chest pain for 1 week. Finished radiation therapy in October for lung cancer. EXAM: CT ANGIOGRAPHY CHEST WITH CONTRAST TECHNIQUE: Multidetector CT imaging of the chest was performed using the standard protocol during bolus administration of intravenous contrast. Multiplanar CT image reconstructions and MIPs were obtained to evaluate the vascular anatomy. CONTRAST:  60 cc of Isovue 370 COMPARISON:  09/26/2016 FINDINGS: Cardiovascular: The quality of this exam for evaluation of pulmonary embolism is good. The only limitation is minimal motion inferiorly. No evidence of pulmonary embolism. Extensive colonic diverticulosis. Ulcerative plaque in the transverse aorta. Mild cardiomegaly, without pericardial effusion. Multivessel coronary artery atherosclerosis. Mediastinum/Nodes: A node within the azygoesophageal recess measures 12 mm on image 45/series 4 versus maximally 7 mm on the prior exam. No hilar adenopathy. Lungs/Pleura: Slight increase in small left pleural effusion. Trace right  pleural fluid or thickening. Moderate centrilobular emphysema. 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 Basilar predominant subpleural reticulation with architectural distortion. Left lower lobe consolidation with only peripheral air bronchograms, increased. Underlying fiducials. Upper Abdomen: Normal imaged portions of the liver, spleen, stomach, pancreas, adrenal glands, kidneys. Musculoskeletal: Diminutive right fifth rib is likely developmental. Review of the MIP images confirms the above findings. IMPRESSION: 1.  No evidence of pulmonary embolism.  Minimal motion degradation. 2. Increase in left lower lobe consolidation with slight increase in small left pleural effusion. Increased consolidation could be due to evolving radiation change. Residual/recurrent disease cannot be excluded. 3. Developing adenopathy within the azygoesophageal recess station of the mediastinum. Suspicious for nodal metastasis. Reactive adenopathy could look similar. This could be re-evaluated with short term follow-up CT or further characterized with PET. 4.  Coronary artery atherosclerosis. Aortic atherosclerosis. 5. 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 Electronically Signed   By: Abigail Miyamoto M.D.   On: 10/24/2016 09:22   Dg Chest Port 1 View  Result Date: 11/08/2016 CLINICAL DATA:  Pneumonia. EXAM: PORTABLE CHEST 1 VIEW COMPARISON:  11/07/2016 .  CT 11/05/2016. FINDINGS: Mediastinum is stable. Cardiac pacer in stable position. Heart size stable. Surgical clips noted over the lower chest. Persistent density is noted in the left lung base consistent with treatment changes and/or residual or recurrent tumor. Stable bilateral interstitial prominence. Pneumonitis cannot be excluded. Small bilateral pleural effusions. No pneumothorax. IMPRESSION: 1. Persistent persistent density in the left lung base without interim change. This could represent treatment changes and/or  residual/recurrent tumor. Pneumonia cannot be excluded. Stable bilateral interstitial prominence. Pneumonitis cannot be excluded. 2. Cardiac pacer stable position. Stable cardiomegaly . No pulmonary venous congestion. Small bilateral pleural effusions. Electronically Signed   By: Marcello Moores  Register   On: 11/08/2016 06:37   Dg Chest Port 1 View  Result Date: 11/07/2016 CLINICAL DATA:  Hypoxia. Left lower lobe lung cancer treated with radiation therapy in  October. EXAM: PORTABLE CHEST 1 VIEW COMPARISON:  09/19/2016 chest radiograph. FINDINGS: Stable configuration of 2 lead right subclavian pacemaker. Fiducial markers overlie the left lower lung. Stable cardiomediastinal silhouette with top-normal heart size and aortic atherosclerosis. No pneumothorax. Stable small left pleural effusion. No right pleural effusion. No pulmonary edema. Patchy consolidation at the left lung base appears slightly worsened since the CT study from 1 day prior. Extensive reticular interstitial opacities at the lung bases appear unchanged. IMPRESSION: 1. Stable small left pleural effusion. 2. Slight worsening of patchy left lung base consolidation, which could represent progression of radiation pneumonitis and/or pneumonia superimposed on tumor. 3. Extensive underlying bibasilar reticular interstitial opacities, suspect underlying interstitial lung disease. 4. Aortic atherosclerosis. Electronically Signed   By: Ilona Sorrel M.D.   On: 11/07/2016 18:22    ASSESSMENT & PLAN:   # 83 year old female patient with history of stage I squamous cell lung cancer status post radiation [finished November 2017] Is currently admitted the hospital for worsening shortness of breath cough chest wall pain.  # Acute respiratory failure- needing 5 L of oxygen. CT scan- increasing left lower lobe consolidation/ also left upper lobe involvement-the pleural effusion. Reviewed the imaging with Dr. Donella Stade radiation oncology. Clinically more likely radiation  pneumonitis; again other etiologies have to be considered in the differential. Recommend thoracentesis; cytology labs to rule out recurrent malignancy.  Paged Dr.Fleming to talk to him re: utility of pleural tap [pt on eliquis]  # Chronic anemia likely secondary to IDA/anemia of chronic disease. Hemoglobin 10.5. Stable.  # AKi/ CKD creatinine baseline on 1.2-1.3 today 1.7. Cont IV Fs.   # Debility/ mild dementia/ overall guarded prognosis. Agree with DNR/DNI.   # Discussed with Dr.kalisetti. Discussed with pt's grandson by the bedside.     Cammie Sickle, MD 11/09/2016 3:05 PM

## 2016-11-10 ENCOUNTER — Inpatient Hospital Stay (HOSPITAL_COMMUNITY)
Admit: 2016-11-10 | Discharge: 2016-11-10 | Disposition: A | Discharge status: Home / Self Care | Payer: Medicare HMO | Attending: Internal Medicine | Admitting: Internal Medicine

## 2016-11-10 ENCOUNTER — Inpatient Hospital Stay: Payer: Medicare HMO

## 2016-11-10 DIAGNOSIS — I5031 Acute diastolic (congestive) heart failure: Secondary | ICD-10-CM

## 2016-11-10 DIAGNOSIS — R5383 Other fatigue: Secondary | ICD-10-CM

## 2016-11-10 DIAGNOSIS — J189 Pneumonia, unspecified organism: Secondary | ICD-10-CM

## 2016-11-10 LAB — BASIC METABOLIC PANEL
Anion gap: 7 (ref 5–15)
BUN: 33 mg/dL — AB (ref 6–20)
CALCIUM: 9.1 mg/dL (ref 8.9–10.3)
CO2: 25 mmol/L (ref 22–32)
CREATININE: 1.11 mg/dL — AB (ref 0.44–1.00)
Chloride: 107 mmol/L (ref 101–111)
GFR calc Af Amer: 51 mL/min — ABNORMAL LOW (ref >60)
GFR, EST NON AFRICAN AMERICAN: 44 mL/min — AB (ref >60)
GLUCOSE: 159 mg/dL — AB (ref 65–99)
Potassium: 4.8 mmol/L (ref 3.5–5.1)
Sodium: 139 mmol/L (ref 135–145)

## 2016-11-10 LAB — ECHOCARDIOGRAM COMPLETE
Height: 64 in
Weight: 2179.2 oz

## 2016-11-10 MED ORDER — CHLORHEXIDINE GLUCONATE 0.12 % MT SOLN
15.0000 mL | Freq: Two times a day (BID) | OROMUCOSAL | Status: DC
  Administered 2016-11-10 – 2016-11-15 (×8): 15 mL via OROMUCOSAL
  Filled 2016-11-10 (×9): qty 15

## 2016-11-10 MED ORDER — FENTANYL 12 MCG/HR TD PT72
12.5000 ug | MEDICATED_PATCH | TRANSDERMAL | Status: DC
  Administered 2016-11-10: 12.5 ug via TRANSDERMAL
  Filled 2016-11-10: qty 1

## 2016-11-10 MED ORDER — ORAL CARE MOUTH RINSE
15.0000 mL | Freq: Two times a day (BID) | OROMUCOSAL | Status: DC
  Administered 2016-11-11 – 2016-11-13 (×5): 15 mL via OROMUCOSAL

## 2016-11-10 NOTE — Care Management Important Message (Signed)
Important Message  Patient Details  Name: Melissa Palmer MRN: [id number] Date of Birth: 02-Jun-1928   Medicare Important Message Given:  Yes    Beverly Sessions, RN 11/10/2016, 5:18 PM

## 2016-11-10 NOTE — Progress Notes (Signed)
Pharmacy Antibiotic Note  Melissa Palmer is a 83 y.o. female admitted on 11/05/2016 with pneumonia and UTI.  Pharmacy has been consulted for Unasyn dosing. Patient was on Zosyn from 3/27-3/30. Patient is also receiving Azithromycin '500mg'$  daily.   Plan: Will start patient on Unasyn 3g IV q6h extended infusion.   3/26 levofloxacin x 1 dose 3/27: Vanc>>3/27 3/27 Zosyn >>  3/30 3/29 Azithromycin >> 3/30 Unasyn >>  Height: '5\' 4"'$  (162.6 cm) Weight: 136 lb 3.2 oz (61.8 kg) IBW/kg (Calculated) : 54.7  Temp (24hrs), Avg:98.1 F (36.7 C), Min:97.6 F (36.4 C), Max:98.5 F (36.9 C)   Recent Labs Lab 11/05/2016 0732 11/07/16 0138 11/07/16 2356 11/08/16 0513 11/10/16 0451  WBC 7.0 8.5  --  9.7  --   CREATININE 1.36* 1.37*  --  1.72* 1.11*  LATICACIDVEN  --  0.8 0.9  --   --     Estimated Creatinine Clearance: 31.4 mL/min (A) (by C-G formula based on SCr of 1.11 mg/dL (H)).    Allergies  Allergen Reactions  . Asa [Aspirin] Anaphylaxis  . Flagyl [Metronidazole] Itching  . Hydrochlorothiazide Itching  . Sulfa Antibiotics Itching    Thank you for allowing pharmacy to be a part of this patient's care.  Pernell Dupre, PharmD, BCPS Clinical Pharmacist 11/10/2016 1:44 PM

## 2016-11-10 NOTE — Progress Notes (Signed)
Pharmacy Antibiotic Note  Melissa Palmer is a 83 y.o. female admitted on 11/03/2016 with pneumonia and UTI.  Pharmacy has been consulted for zosyn dosing. Patient is also receiving Azithromycin '500mg'$  daily.   Plan: Will continue zosyn 3.375g IV q8h extended infusion. Recommend deescalating therapy to Unasyn if patient does not have Pseudomonas risk factors.  3/26 levofloxacin x 1 dose 3/27: Vanc>>3/27 3/27 Zosyn >>   3/29 Azithromycin >>  Height: '5\' 4"'$  (162.6 cm) Weight: 136 lb 3.2 oz (61.8 kg) IBW/kg (Calculated) : 54.7  Temp (24hrs), Avg:98.2 F (36.8 C), Min:97.6 F (36.4 C), Max:98.5 F (36.9 C)   Recent Labs Lab 10/12/2016 0732 11/07/16 0138 11/07/16 2356 11/08/16 0513 11/10/16 0451  WBC 7.0 8.5  --  9.7  --   CREATININE 1.36* 1.37*  --  1.72* 1.11*  LATICACIDVEN  --  0.8 0.9  --   --     Estimated Creatinine Clearance: 31.4 mL/min (A) (by C-G formula based on SCr of 1.11 mg/dL (H)).    Allergies  Allergen Reactions  . Asa [Aspirin] Anaphylaxis  . Flagyl [Metronidazole] Itching  . Hydrochlorothiazide Itching  . Sulfa Antibiotics Itching    Thank you for allowing pharmacy to be a part of this patient's care.  Pernell Dupre, PharmD, BCPS Clinical Pharmacist 11/10/2016 7:51 AM

## 2016-11-10 NOTE — Progress Notes (Signed)
[facility] [place] NAME: Melissa Palmer    MR#:  [id number]  DATE OF BIRTH:  27-Nov-1927  SUBJECTIVE:  CHIEF COMPLAINT:   Chief Complaint  Patient presents with  . Muscle Pain   - Still has left-sided chest pain. Remains alert. Became significantly hypoxic on ambulation yesterday with physical therapy and currently on high flow nasal cannula.  REVIEW OF SYSTEMS:  Review of Systems  Constitutional: Negative for chills, fever and malaise/fatigue.  HENT: Negative for congestion, ear discharge, hearing loss and nosebleeds.   Eyes: Negative for blurred vision and double vision.  Respiratory: Positive for shortness of breath. Negative for cough and sputum production.   Cardiovascular: Positive for chest pain. Negative for leg swelling.  Gastrointestinal: Negative for abdominal pain, constipation, diarrhea, heartburn and vomiting.  Genitourinary: Negative for dysuria.  Musculoskeletal: Positive for myalgias. Negative for back pain.  Neurological: Positive for weakness. Negative for dizziness, sensory change, speech change, focal weakness and seizures.  Psychiatric/Behavioral: Negative for depression.    DRUG ALLERGIES:   Allergies  Allergen Reactions  . Asa [Aspirin] Anaphylaxis  . Flagyl [Metronidazole] Itching  . Hydrochlorothiazide Itching  . Sulfa Antibiotics Itching    VITALS:  Blood pressure (!) 163/63, pulse 91, temperature 97.6 F (36.4 C), temperature source Axillary, resp. rate (!) 22, height '5\' 4"'$  (1.626 m), weight 61.8 kg (136 lb 3.2 oz), SpO2 91 %.  PHYSICAL EXAMINATION:  Physical Exam  GENERAL:  83 y.o.-year-old patient lying in the bed with no acute distress.  EYES: Pupils equal, round, reactive to light and accommodation. No scleral icterus. Extraocular muscles intact.  HEENT: Head atraumatic, normocephalic. Oropharynx and nasopharynx clear. On high flow nasal cannula NECK:  Supple, no jugular venous distention. No  thyroid enlargement, no tenderness.  LUNGS: Normal breath sounds bilaterally, no wheezing. Bibasilar crackles noted.  No use of accessory muscles of respiration. decreased breath sounds at the bases CARDIOVASCULAR: S1, S2 normal. No  rubs, or gallops. 3/6 systolic murmur present ABDOMEN: Soft, nontender, nondistended. Bowel sounds present. No organomegaly or mass.  EXTREMITIES: No pedal edema, cyanosis, or clubbing.  NEUROLOGIC: Cranial nerves II through XII are intact. Muscle strength 5/5 in all extremities. Sensation intact. Gait not checked. Global weakness noted. PSYCHIATRIC: The patient is alert and oriented x 2-3.  SKIN: No obvious rash, lesion, or ulcer.    LABORATORY PANEL:   CBC  Recent Labs Lab 11/08/16 0513  WBC 9.7  HGB 10.5*  HCT 32.2*  PLT 299   ------------------------------------------------------------------------------------------------------------------  Chemistries   Recent Labs Lab 10/22/2016 0732  11/10/16 0451  NA 136  < > 139  K 3.7  < > 4.8  CL 103  < > 107  CO2 26  < > 25  GLUCOSE 110*  < > 159*  BUN 16  < > 33*  CREATININE 1.36*  < > 1.11*  CALCIUM 8.8*  < > 9.1  AST 19  --   --   ALT 26  --   --   ALKPHOS 42  --   --   BILITOT 0.6  --   --   < > = values in this interval not displayed. ------------------------------------------------------------------------------------------------------------------  Cardiac Enzymes  Recent Labs Lab 11/08/16 0513  TROPONINI 0.03*   ------------------------------------------------------------------------------------------------------------------  RADIOLOGY:  Dg Chest Port 1 View  Result Date: 11/10/2016 CLINICAL DATA:  Shortness of breath and chest pain EXAM: PORTABLE CHEST 1 VIEW COMPARISON:  November 08, 2016 chest radiograph and chest CT  November 06, 2016 FINDINGS: There is persistent consolidation in the left lower lobe with left pleural effusion. There is underlying diffuse fibrotic type change. There  may well be superimposed interstitial edema in this area as well. Heart is upper normal in size with pulmonary vascularity within normal limits. Pacemaker leads are attached to the right atrium and right ventricle. There is atherosclerotic calcification in the aorta. There are surgical clips over the right breast. IMPRESSION: Persistent airspace consolidation left base with left pleural effusion. Suspect pneumonia in this area; underlying mass in this area cannot be excluded. Widespread pulmonary fibrosis remains. There may be a degree of superimposed interstitial edema. The overall appearance is stable compared to recent prior studies. Cardiac silhouette is unchanged.  There is aortic atherosclerosis. Electronically Signed   By: Lowella Grip III M.D.   On: 11/10/2016 08:12    EKG:   Orders placed or performed during the hospital encounter of 11/03/2016  . ED EKG  . ED EKG  . EKG 12-Lead  . EKG 12-Lead  . EKG 12-Lead  . 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  #1 acute hypoxic respiratory failure- Not on home oxygen -Acute decompensation after physical therapy, started on high flow nasal cannula.Currently flow rate of 40 L, FiO2 of 83% -Secondary to radiation pneumonitis. -Pulmonary consulted. Nebs as needed. High-dose steroids added. - possible PNA cannot be excluded- so continue zosyn.  - once pain is improved, encourage incentive spirometry  #2 Sepsis- UTI and PNA - on zosyn, the cultures are negative. Urine cultures growing Proteus mirabilis which is sensitive to most antibiotics -We will narrow antibiotics in the next day or 2 depending upon her clinical condition.  #2 left-sided  chest pain-secondary to possible radiation pneumonitis. CT of the chest on admission 2 days ago with no further pneumonia or pulmonary embolism noted. Repeat chest x-rays in the last 2 days without any acute changes. -Troponins have remained negative. -Continue pain medications as needed. Lidoderm patch place.  -Low-dose fentanyl patch added today.  #3 history of atrial fibrillation-rate controlled, on Cardizem orally. -On eliquis for anticoagulation -Trouble with pacemaker yesterday, cardiology consulted to enteral gait pacemaker. Echocardiogram today for significant dyspnea.  #4 left lung cancer-squamous cell carcinoma of left lung base, status post radiation, finished in 2017 October. -Currently not on any chemotherapy actively. Oncology consulted  #5 hypertension-On clonidine and lisinopril and Cardizem  #6 DVT prophylaxis-patient already on eliquis    Physical Therapy consulted.   All the records are reviewed and case discussed with Care Management/Social Workerr. Management plans discussed with the patient, family and they are in agreement.  CODE STATUS: DNR  TOTAL TIME TAKING CARE OF THIS PATIENT: 38 minutes.   POSSIBLE D/C IN 2-3 DAYS, DEPENDING ON CLINICAL CONDITION.   Kariss Longmire M.D on 11/10/2016 at 10:07 AM  Between 7am to 6pm - Pager - [phone]  After 6pm go to [email or web address] - password [email or web address] [facility]  Office  [phone]  CC: Primary care physician; Denita Lung, DO

## 2016-11-10 NOTE — Plan of Care (Signed)
Problem: SLP Dysphagia Goals Goal: Misc Dysphagia Goal Pt will safely tolerate po diet of least restrictive consistency w/ no overt s/s of aspiration noted by Staff/pt/family x3 sessions.    

## 2016-11-10 NOTE — NC FL2 (Signed)
[place] LEVEL OF CARE SCREENING TOOL     IDENTIFICATION  Patient Name: Melissa Palmer Birthdate: 1927/11/29 Sex: female Admission Date (Current Location): 10/20/2016  [place] and [place] Number:  Engineering geologist and Address:  [facility], [address] [place], [place] [address]      Provider Number: [id number]  Attending Physician Name and Address:  Gladstone Lighter, MD  Relative Name and Phone Number:       Current Level of Care: Hospital Recommended Level of Care: [place] Prior Approval Number:    Date Approved/Denied:   PASRR Number:  [id number])  Discharge Plan: SNF    Current Diagnoses: Patient Active Problem List   Diagnosis Date Noted  . Pneumonia 11/09/2016  . Iron deficiency anemia due to chronic blood loss 09/20/2016  . Radiation pneumonitis ([place]) 09/19/2016  . Primary cancer of left lower lobe of lung ([place]) 04/13/2016  . Stroke (cerebrum) ([place]) 03/07/2016    Orientation RESPIRATION BLADDER Height & Weight     Self, Place  O2 (Currently on high flow nasal cannula to be weaned down. ) Incontinent Weight: 136 lb 3.2 oz (61.8 kg) Height:  '5\' 4"'$  (162.6 cm)  BEHAVIORAL SYMPTOMS/MOOD NEUROLOGICAL BOWEL NUTRITION STATUS   (none)  (none) Continent Diet (Dysphagia level 3 w/ minced/cut meats, moistened well; Thin liquids VIA CUP - NO Straws)  AMBULATORY STATUS COMMUNICATION OF NEEDS Skin   Extensive Assist Verbally Normal                       Personal Care Assistance Level of Assistance  Bathing, Feeding, Dressing Bathing Assistance: Limited assistance Feeding assistance: Independent Dressing Assistance: Limited assistance     Functional Limitations Info  Sight, Hearing, Speech Sight Info: Adequate Hearing Info: Adequate Speech Info: Adequate    SPECIAL CARE FACTORS FREQUENCY  PT (By licensed PT), OT (By licensed OT)     PT Frequency:  (5) OT Frequency:  (5)             Contractures      Additional Factors Info  Code Status, Allergies Code Status Info:  (DNR ) Allergies Info:  (Asa Aspirin, Flagyl Metronidazole, Hydrochlorothiazide, Sulfa Antibiotics)           Current Medications (11/10/2016):  This is the current hospital active medication list Current Facility-Administered Medications  Medication Dose Route Frequency Provider Last Rate Last Dose  . acetaminophen (TYLENOL) tablet 650 mg  650 mg Oral Q6H PRN Demetrios Loll, MD   650 mg at 11/07/16 1751  . ALPRAZolam Duanne Moron) tablet 0.25 mg  0.25 mg Oral BID PRN Hillary Bow, MD   0.25 mg at 11/09/16 0349  . apixaban (ELIQUIS) tablet 2.5 mg  2.5 mg Oral BID Demetrios Loll, MD   2.5 mg at 11/10/16 0824  . azithromycin (ZITHROMAX) tablet 500 mg  500 mg Oral Daily Gladstone Lighter, MD   500 mg at 11/10/16 0824  . cloNIDine (CATAPRES) tablet 0.1 mg  0.1 mg Oral QHS Demetrios Loll, MD   0.1 mg at 11/09/16 2140  . diltiazem (CARDIZEM CD) 24 hr capsule 360 mg  360 mg Oral Daily Demetrios Loll, MD   360 mg at 11/10/16 0824  . docusate sodium (COLACE) capsule 100 mg  100 mg Oral BID Demetrios Loll, MD   100 mg at 11/10/16 0824  . fentaNYL (DURAGESIC - dosed mcg/hr) 12.5 mcg  12.5 mcg Transdermal Q72H Gladstone Lighter, MD   12.5 mcg at 11/10/16 1029  .  fluvoxaMINE (LUVOX) tablet 25 mg  25 mg Oral Q1200 Demetrios Loll, MD   25 mg at 11/07/16 1318  . guaiFENesin (ROBITUSSIN) 100 MG/5ML solution 100 mg  5 mL Oral Q4H PRN Demetrios Loll, MD      . ipratropium-albuterol (DUONEB) 0.5-2.5 (3) MG/3ML nebulizer solution 3 mL  3 mL Nebulization Q6H Gladstone Lighter, MD   3 mL at 11/10/16 0725  . labetalol (NORMODYNE,TRANDATE) injection 10 mg  10 mg Intravenous Q2H PRN Lance Coon, MD   10 mg at 11/07/16 0016  . lidocaine (LIDODERM) 5 % 1 patch  1 patch Transdermal Q24H Gladstone Lighter, MD   1 patch at 11/10/16 0824  . lisinopril (PRINIVIL,ZESTRIL) tablet 10 mg  10 mg Oral BID Gladstone Lighter, MD   10 mg at 11/10/16 0824  . loratadine (CLARITIN)  tablet 10 mg  10 mg Oral Daily Demetrios Loll, MD   10 mg at 11/10/16 2446  . methylPREDNISolone sodium succinate (SOLU-MEDROL) 125 mg/2 mL injection 60 mg  60 mg Intravenous Q8H Gladstone Lighter, MD   60 mg at 11/10/16 0547  . ondansetron (ZOFRAN) injection 4 mg  4 mg Intravenous Q6H PRN Demetrios Loll, MD      . oxyCODONE (Oxy IR/ROXICODONE) immediate release tablet 5 mg  5 mg Oral Q6H PRN Hillary Bow, MD   5 mg at 11/10/16 0824  . pantoprazole (PROTONIX) EC tablet 40 mg  40 mg Oral Daily Demetrios Loll, MD   40 mg at 11/10/16 2863  . piperacillin-tazobactam (ZOSYN) IVPB 3.375 g  3.375 g Intravenous Q8H Demetrios Loll, MD   3.375 g at 11/10/16 1029  . polyethylene glycol (MIRALAX / GLYCOLAX) packet 17 g  17 g Oral Daily PRN Srikar Sudini, MD      . senna-docusate (Senokot-S) tablet 1 tablet  1 tablet Oral QHS PRN Demetrios Loll, MD         Discharge Medications: Please see discharge summary for a list of discharge medications.  Relevant Imaging Results:  Relevant Lab Results:   Additional Information  (SSN: [id number])  Sample, Veronia Beets, LCSW

## 2016-11-10 NOTE — Progress Notes (Signed)
CH responded to a PG for AD completion. Pt and Family will complete HCPOC tomorrow. CH offered prayer for healing and comfort. Pt and family appreciated the prayer.    11/10/16 1500  Clinical Encounter Type  Visited With Patient;Patient and family together  Visit Type Initial;Spiritual support  Referral From Nurse  Consult/Referral To Chaplain  Spiritual Encounters  Spiritual Needs Literature;Prayer

## 2016-11-10 NOTE — Progress Notes (Signed)
Speech Language Pathology Treatment: Dysphagia  Patient Details Name: Melissa Palmer MRN: [id number] DOB: September 25, 1927 Today's Date: 11/10/2016 Time: 1448-1856 SLP Time Calculation (min) (ACUTE ONLY): 45 min  Assessment / Plan / Recommendation Clinical Impression  Pt seen for toleration of diet. Pt has had a decline in Pulmonary status w/ Acute decompensation after physical therapy, started on high flow nasal cannula.Currently flow rate of 40 L, FiO2 of 80% sSecondary to radiation pneumonitis; MD treating including high-dose steroids. Pt has had a repeat chest x-rays in the last 2 days without any acute changes. Pt has been taking bites and sips but family indicated it's minimal w/ pt declining most po's. She is verbally conversive; ongoing confusion secondary to baseline Dementia, pleasant. With gentle encouragement but not being pushy, Jello was offered and pt fed herself the entire 4 ozs w/ no decline in respiratory status and no overt s/s of aspiration noted; oral phase wfl for bolus management/clearing. Pt appeared to pace herself appropriately to avoid increased SOB/WOB.  Pt remains at increased risk for aspiration d/t declined Pulmonary status but recommend continue w/ current Dysphagia level 3 diet w/ more purees and foods broken down/moist added into meals to lessen exertion of oral intake. Recommend strict aspiration precautions; education on aspiration precautions; impact of respiratory status on swallowing; impact of Cognitive status on oral intake overall; food consistency and preparation; food and drink options. ST services will f/u w/ pt's status next 2-3 days for ongoing monitoring and education as needed. Recommend Dietician f/u for nutritional supplements - drink form may be easiest. NSG updated. Family agreed.   HPI HPI: Pt  is a 83 y.o. female with a known history of Lung cancer, status post radiation, A. fib, status post pacemaker, HTN, dementia, hypertension, CVA and TIA. The patient  presented to the ED with chest pain on the left side in radiation area. She has a history of lung cancer on the left side.  She has not had radiation since last October. She denies any fever or chills but has shortness of breath and cough. CT angiogram of the chest didn't show any PE but left-sided consolidation and possible pneumonia. Pt had a decline in pulmonary status working w/ PT and is now on HFNC for increased O2 support. Pt verbally conversive; confusion noted in some responses but pleasant. No significant SOB w/ conversation but pt took her time. Family stated pt was taking bites and sips but not much po's overall - denied any overt s/s of aspiration when she take po's w/ them.       SLP Plan  Continue with current plan of care       Recommendations  Diet recommendations: Dysphagia 3 (mechanical soft);Thin liquid (w/ added purees and softened foods) Liquids provided via: Cup;No straw Medication Administration: Whole meds with puree (for easier swallowing d/t respiratory status) Supervision: Patient able to self feed;Staff to assist with self feeding;Intermittent supervision to cue for compensatory strategies Compensations: Minimize environmental distractions;Slow rate;Small sips/bites;Multiple dry swallows after each bite/sip;Follow solids with liquid Postural Changes and/or Swallow Maneuvers: Seated upright 90 degrees;Upright 30-60 min after meal (rest breaks as needed to avoid WOB/SOB)                General recommendations:  (Dietician f/u) Oral Care Recommendations: Oral care BID;Staff/trained caregiver to provide oral care;Patient independent with oral care Follow up Recommendations: None SLP Visit Diagnosis: Dysphagia, oropharyngeal phase (R13.12) Plan: Continue with current plan of care       GO  Orinda Kenner, MS, CCC-SLP Aseel Truxillo 11/10/2016, 1:28 PM

## 2016-11-10 NOTE — Progress Notes (Signed)
*  PRELIMINARY RESULTS* Echocardiogram 2D Echocardiogram has been performed.  Melissa Palmer 11/10/2016, 12:41 PM

## 2016-11-10 NOTE — Progress Notes (Signed)
[place] NOTE  Patient Care Team: Denita Lung, DO as PCP - General (Internal Medicine)  CHIEF COMPLAINTS/PURPOSE OF CONSULTATION: Lung cancer; worsening respiratory distress   CC: Patient continues to have difficulty breathing. Does worsen overnight. She is currently on high flow nasal oxygen. States to have a rough night if clinically sleeping at night "rough dreams". No hemoptysis. Patient de-sats on exertion.   MEDICAL HISTORY:  Past Medical History:  Diagnosis Date  . A-fib ([place])   . Cancer ([place])    lung ca  . Cataract   . Dementia   . Hypertension   . Hypertension   . TIA (transient ischemic attack)     SURGICAL HISTORY: Past Surgical History:  Procedure Laterality Date  . BREAST SURGERY    . COLONOSCOPY    . HYSTEROTOMY    . UPPER GI ENDOSCOPY      SOCIAL HISTORY: Social History   Social History  . Marital status: Married    Spouse name: N/A  . Number of children: N/A  . Years of education: N/A   Occupational History  . Not on file.   Social History Main Topics  . Smoking status: Former Research scientist (life sciences)  . Smokeless tobacco: Never Used  . Alcohol use No  . Drug use: No  . Sexual activity: Not on file   Other Topics Concern  . Not on file   Social History Narrative  . No narrative on file    FAMILY HISTORY: History reviewed. No pertinent family history.  ALLERGIES:  is allergic to asa [aspirin]; flagyl [metronidazole]; hydrochlorothiazide; and sulfa antibiotics.  MEDICATIONS:  Current Facility-Administered Medications  Medication Dose Route Frequency Provider Last Rate Last Dose  . acetaminophen (TYLENOL) tablet 650 mg  650 mg Oral Q6H PRN Demetrios Loll, MD   650 mg at 11/07/16 1751  . ALPRAZolam Duanne Moron) tablet 0.25 mg  0.25 mg Oral BID PRN Hillary Bow, MD   0.25 mg at 11/09/16 0349  . apixaban (ELIQUIS) tablet 2.5 mg  2.5 mg Oral BID Demetrios Loll, MD   2.5 mg at 11/10/16 0824  . azithromycin (ZITHROMAX) tablet 500 mg  500 mg Oral  Daily Gladstone Lighter, MD   500 mg at 11/10/16 0824  . cloNIDine (CATAPRES) tablet 0.1 mg  0.1 mg Oral QHS Demetrios Loll, MD   0.1 mg at 11/09/16 2140  . diltiazem (CARDIZEM CD) 24 hr capsule 360 mg  360 mg Oral Daily Demetrios Loll, MD   360 mg at 11/10/16 0824  . docusate sodium (COLACE) capsule 100 mg  100 mg Oral BID Demetrios Loll, MD   100 mg at 11/10/16 0824  . fentaNYL (DURAGESIC - dosed mcg/hr) 12.5 mcg  12.5 mcg Transdermal Q72H Gladstone Lighter, MD   12.5 mcg at 11/10/16 1029  . fluvoxaMINE (LUVOX) tablet 25 mg  25 mg Oral Q1200 Demetrios Loll, MD   25 mg at 11/10/16 1430  . guaiFENesin (ROBITUSSIN) 100 MG/5ML solution 100 mg  5 mL Oral Q4H PRN Demetrios Loll, MD      . ipratropium-albuterol (DUONEB) 0.5-2.5 (3) MG/3ML nebulizer solution 3 mL  3 mL Nebulization Q6H Gladstone Lighter, MD   3 mL at 11/10/16 1347  . labetalol (NORMODYNE,TRANDATE) injection 10 mg  10 mg Intravenous Q2H PRN Lance Coon, MD   10 mg at 11/07/16 0016  . lidocaine (LIDODERM) 5 % 1 patch  1 patch Transdermal Q24H Gladstone Lighter, MD   1 patch at 11/10/16 0824  . lisinopril (PRINIVIL,ZESTRIL) tablet 10 mg  10 mg Oral BID Gladstone Lighter, MD   10 mg at 11/10/16 0824  . loratadine (CLARITIN) tablet 10 mg  10 mg Oral Daily Demetrios Loll, MD   10 mg at 11/10/16 5638  . methylPREDNISolone sodium succinate (SOLU-MEDROL) 125 mg/2 mL injection 60 mg  60 mg Intravenous Q8H Gladstone Lighter, MD   60 mg at 11/10/16 1430  . ondansetron (ZOFRAN) injection 4 mg  4 mg Intravenous Q6H PRN Demetrios Loll, MD      . oxyCODONE (Oxy IR/ROXICODONE) immediate release tablet 5 mg  5 mg Oral Q6H PRN Hillary Bow, MD   5 mg at 11/10/16 0824  . pantoprazole (PROTONIX) EC tablet 40 mg  40 mg Oral Daily Demetrios Loll, MD   40 mg at 11/10/16 9373  . polyethylene glycol (MIRALAX / GLYCOLAX) packet 17 g  17 g Oral Daily PRN Srikar Sudini, MD      . senna-docusate (Senokot-S) tablet 1 tablet  1 tablet Oral QHS PRN Demetrios Loll, MD          .  PHYSICAL  EXAMINATION:  Vitals:   11/10/16 0813 11/10/16 1351  BP: (!) 163/63 (!) 168/60  Pulse: 91 (!) 102  Resp:  (!) 21  Temp:  97.4 F (36.3 C)   Filed Weights   11/07/2016 0659 10/24/2016 1311  Weight: 136 lb (61.7 kg) 136 lb 3.2 oz (61.8 kg)    GENERAL: Frail-appearing Caucasian female patient Alert, no distress and comfortable. She is alone; on  HF [place].  EYES: no pallor or icterus OROPHARYNX: no thrush or ulceration. NECK: supple, no masses felt LYMPH:  no palpable lymphadenopathy in the cervical, axillary or inguinal regions LUNGS: decreased breath sounds to auscultation and no more than right.  HEART/CVS: regular rate & rhythm and no murmurs; No lower extremity edema ABDOMEN: abdomen soft, non-tender and normal bowel sounds Musculoskeletal:no cyanosis of digits and no clubbing  PSYCH: alert & oriented x 3 with fluent speech NEURO: no focal motor/sensory deficits SKIN:  no rashes or significant lesions  LABORATORY DATA:  I have reviewed the data as listed Lab Results  Component Value Date   WBC 9.7 11/08/2016   HGB 10.5 (L) 11/08/2016   HCT 32.2 (L) 11/08/2016   MCV 73.6 (L) 11/08/2016   PLT 299 11/08/2016    Recent Labs  04/25/16 1539 09/19/16 0934 10/12/2016 0732 11/07/16 0138 11/08/16 0513 11/10/16 0451  NA 135 135 136 135 137 139  K 4.0 3.7 3.7 3.9 3.2* 4.8  CL 104 99* 103 107 104 107  CO2 '24 26 26 23 27 25  '$ GLUCOSE 101* 117* 110* 118* 117* 159*  BUN '19 15 16 17 19 '$ 33*  CREATININE 1.30* 1.17* 1.36* 1.37* 1.72* 1.11*  CALCIUM 9.1 9.3 8.8* 8.1* 7.9* 9.1  GFRNONAA 36* 41* 34* 34* 26* 44*  GFRAA 42* 47* 40* 39* 30* 51*  PROT 7.6 8.4* 5.9*  --   --   --   ALBUMIN 3.9 3.7 2.7*  --   --   --   AST '18 18 19  '$ --   --   --   ALT 9* 9* 26  --   --   --   ALKPHOS 66 78 42  --   --   --   BILITOT 0.3 0.3 0.6  --   --   --     RADIOGRAPHIC STUDIES: I have personally reviewed the radiological images as listed and agreed with the findings in the report. Ct Head Wo  Contrast  Result Date: 11/08/2016 CLINICAL DATA:  Body aches and generalized weakness for 3 months. EXAM: CT HEAD WITHOUT CONTRAST TECHNIQUE: Contiguous axial images were obtained from the base of the skull through the vertex without intravenous contrast. COMPARISON:  Head CT scan 03/08/2016. FINDINGS: Brain: The brain is atrophic with fairly extensive chronic microvascular ischemic change. Remote right frontal infarct is noted. No acute abnormality including infarction, hemorrhage, mass lesion, mass effect, midline shift or abnormal extra-axial fluid collection. No hydrocephalus or pneumocephalus. Vascular: Atherosclerosis noted. Skull: Intact. Sinuses/Orbits: No acute abnormality. Other: None. IMPRESSION: No acute abnormality. Atrophy, chronic microvascular ischemic change remote right frontal infarct. Atherosclerosis. Electronically Signed   By: Inge Rise M.D.   On: 10/12/2016 11:10   Ct Angio Chest Pe W And/or Wo Contrast  Result Date: 10/17/2016 CLINICAL DATA:  Cough and chest pain for 1 week. Finished radiation therapy in October for lung cancer. EXAM: CT ANGIOGRAPHY CHEST WITH CONTRAST TECHNIQUE: Multidetector CT imaging of the chest was performed using the standard protocol during bolus administration of intravenous contrast. Multiplanar CT image reconstructions and MIPs were obtained to evaluate the vascular anatomy. CONTRAST:  60 cc of Isovue 370 COMPARISON:  09/26/2016 FINDINGS: Cardiovascular: The quality of this exam for evaluation of pulmonary embolism is good. The only limitation is minimal motion inferiorly. No evidence of pulmonary embolism. Extensive colonic diverticulosis. Ulcerative plaque in the transverse aorta. Mild cardiomegaly, without pericardial effusion. Multivessel coronary artery atherosclerosis. Mediastinum/Nodes: A node within the azygoesophageal recess measures 12 mm on image 45/series 4 versus maximally 7 mm on the prior exam. No hilar adenopathy. Lungs/Pleura: Slight  increase in small left pleural effusion. Trace right pleural fluid or thickening. Moderate centrilobular emphysema. 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 Basilar predominant subpleural reticulation with architectural distortion. Left lower lobe consolidation with only peripheral air bronchograms, increased. Underlying fiducials. Upper Abdomen: Normal imaged portions of the liver, spleen, stomach, pancreas, adrenal glands, kidneys. Musculoskeletal: Diminutive right fifth rib is likely developmental. Review of the MIP images confirms the above findings. IMPRESSION: 1.  No evidence of pulmonary embolism.  Minimal motion degradation. 2. Increase in left lower lobe consolidation with slight increase in small left pleural effusion. Increased consolidation could be due to evolving radiation change. Residual/recurrent disease cannot be excluded. 3. Developing adenopathy within the azygoesophageal recess station of the mediastinum. Suspicious for nodal metastasis. Reactive adenopathy could look similar. This could be re-evaluated with short term follow-up CT or further characterized with PET. 4.  Coronary artery atherosclerosis. Aortic atherosclerosis. 5. 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 Electronically Signed   By: Abigail Miyamoto M.D.   On: 11/02/2016 09:22   Dg Chest Port 1 View  Result Date: 11/10/2016 CLINICAL DATA:  Shortness of breath and chest pain EXAM: PORTABLE CHEST 1 VIEW COMPARISON:  November 08, 2016 chest radiograph and chest CT November 06, 2016 FINDINGS: There is persistent consolidation in the left lower lobe with left pleural effusion. There is underlying diffuse fibrotic type change. There may well be superimposed interstitial edema in this area as well. Heart is upper normal in size with pulmonary vascularity within normal limits. Pacemaker leads are attached to the right atrium and right ventricle. There is atherosclerotic calcification in the  aorta. There are surgical clips over the right breast. IMPRESSION: Persistent airspace consolidation left base with left pleural effusion. Suspect pneumonia in this area; underlying mass in this area cannot be excluded. Widespread pulmonary fibrosis remains. There may be a degree of superimposed interstitial edema. The overall  appearance is stable compared to recent prior studies. Cardiac silhouette is unchanged.  There is aortic atherosclerosis. Electronically Signed   By: Lowella Grip III M.D.   On: 11/10/2016 08:12   Dg Chest Port 1 View  Result Date: 11/08/2016 CLINICAL DATA:  Pneumonia. EXAM: PORTABLE CHEST 1 VIEW COMPARISON:  11/07/2016 .  CT 11/10/2016. FINDINGS: Mediastinum is stable. Cardiac pacer in stable position. Heart size stable. Surgical clips noted over the lower chest. Persistent density is noted in the left lung base consistent with treatment changes and/or residual or recurrent tumor. Stable bilateral interstitial prominence. Pneumonitis cannot be excluded. Small bilateral pleural effusions. No pneumothorax. IMPRESSION: 1. Persistent persistent density in the left lung base without interim change. This could represent treatment changes and/or residual/recurrent tumor. Pneumonia cannot be excluded. Stable bilateral interstitial prominence. Pneumonitis cannot be excluded. 2. Cardiac pacer stable position. Stable cardiomegaly . No pulmonary venous congestion. Small bilateral pleural effusions. Electronically Signed   By: Marcello Moores  Register   On: 11/08/2016 06:37   Dg Chest Port 1 View  Result Date: 11/07/2016 CLINICAL DATA:  Hypoxia. Left lower lobe lung cancer treated with radiation therapy in October. EXAM: PORTABLE CHEST 1 VIEW COMPARISON:  09/19/2016 chest radiograph. FINDINGS: Stable configuration of 2 lead right subclavian pacemaker. Fiducial markers overlie the left lower lung. Stable cardiomediastinal silhouette with top-normal heart size and aortic atherosclerosis. No  pneumothorax. Stable small left pleural effusion. No right pleural effusion. No pulmonary edema. Patchy consolidation at the left lung base appears slightly worsened since the CT study from 1 day prior. Extensive reticular interstitial opacities at the lung bases appear unchanged. IMPRESSION: 1. Stable small left pleural effusion. 2. Slight worsening of patchy left lung base consolidation, which could represent progression of radiation pneumonitis and/or pneumonia superimposed on tumor. 3. Extensive underlying bibasilar reticular interstitial opacities, suspect underlying interstitial lung disease. 4. Aortic atherosclerosis. Electronically Signed   By: Ilona Sorrel M.D.   On: 11/07/2016 18:22    ASSESSMENT & PLAN:   # 83 year old female patient with history of stage I squamous cell lung cancer status post radiation [finished November 2017] Is currently admitted the hospital for worsening shortness of breath cough chest wall pain.  # Acute respiratory failure-pneumonitis-Radiation versus infectious vs recurrent cancer. no significant improvement noted patient currently on high flow nasal oxygen.  Continue with the high-dose steroids; antibiotics. Await 2-D echo. Await pulmonary in put.   # Chronic anemia likely secondary to IDA/anemia of chronic disease. Hemoglobin 10.5. Stable.  # Debility/ mild dementia/ overall guarded prognosis. Agree with DNR/DNI.   # Overall poor prognosis. Dr.Finnegan on call over weekend if needed.    Cammie Sickle, MD 11/10/2016 6:08 PM

## 2016-11-11 DIAGNOSIS — J9611 Chronic respiratory failure with hypoxia: Secondary | ICD-10-CM

## 2016-11-11 LAB — CULTURE, BLOOD (ROUTINE X 2)
CULTURE: NO GROWTH
Culture: NO GROWTH

## 2016-11-11 LAB — BASIC METABOLIC PANEL
ANION GAP: 7 (ref 5–15)
BUN: 38 mg/dL — ABNORMAL HIGH (ref 6–20)
CHLORIDE: 106 mmol/L (ref 101–111)
CO2: 27 mmol/L (ref 22–32)
Calcium: 8.9 mg/dL (ref 8.9–10.3)
Creatinine, Ser: 1.16 mg/dL — ABNORMAL HIGH (ref 0.44–1.00)
GFR calc Af Amer: 48 mL/min — ABNORMAL LOW (ref >60)
GFR, EST NON AFRICAN AMERICAN: 41 mL/min — AB (ref >60)
Glucose, Bld: 151 mg/dL — ABNORMAL HIGH (ref 65–99)
POTASSIUM: 4.6 mmol/L (ref 3.5–5.1)
SODIUM: 140 mmol/L (ref 135–145)

## 2016-11-11 MED ORDER — FUROSEMIDE 10 MG/ML IJ SOLN
40.0000 mg | INTRAMUSCULAR | Status: AC
  Administered 2016-11-11: 08:00:00 40 mg via INTRAVENOUS
  Filled 2016-11-11: qty 4

## 2016-11-11 MED ORDER — ENSURE ENLIVE PO LIQD
237.0000 mL | Freq: Two times a day (BID) | ORAL | Status: DC
  Administered 2016-11-11: 237 mL via ORAL

## 2016-11-11 MED ORDER — SODIUM CHLORIDE 0.9 % IV SOLN
3.0000 g | Freq: Four times a day (QID) | INTRAVENOUS | Status: DC
  Administered 2016-11-11 – 2016-11-12 (×4): 3 g via INTRAVENOUS
  Filled 2016-11-11 (×6): qty 3

## 2016-11-11 MED ORDER — FENTANYL 25 MCG/HR TD PT72
25.0000 ug | MEDICATED_PATCH | TRANSDERMAL | Status: DC
  Administered 2016-11-13: 25 ug via TRANSDERMAL
  Filled 2016-11-11: qty 1

## 2016-11-11 MED ORDER — CLONIDINE HCL 0.1 MG PO TABS
0.1000 mg | ORAL_TABLET | Freq: Two times a day (BID) | ORAL | Status: DC
  Administered 2016-11-11 – 2016-11-13 (×5): 0.1 mg via ORAL
  Filled 2016-11-11 (×5): qty 1

## 2016-11-11 NOTE — Progress Notes (Signed)
qPhysical Therapy Treatment Patient Details Name: Melissa Palmer MRN: [id number] DOB: 07/20/1928 Today's Date: 11/11/2016    History of Present Illness Pt is an 83 y.o. female presenting to hospital with body pain (pt with h/o radiation therapy for spot on lungs).  Pt admitted with community acquired PNA, UTI, and acute hypoxic respiratory failure.  PMH includes L upper lung mass, CA, dementia, htn, TIA, a-fib, pacemaker, chronic L chest pain d/t lung CA.    PT Comments    Pt agreeable to PT; reports pain that comes/goes in left upper quadrant with no rhyme or reason. When pain strikes, pt notes it is a 10+. Pt with shortness of breath with low level supine bed exercises. Heart rate increases from 108 to 115 and O2 saturation decreases from 85% to 79% wit complaints of fatigue and shortness of breath. Pt also complains of general malaise. No further treatment attempted at this time. Continue PT to progress activity tolerance with safe heart rate and O2 saturation levels.   Follow Up Recommendations  SNF     Equipment Recommendations  Rolling walker with 5" wheels    Recommendations for Other Services       Precautions / Restrictions Precautions Precautions: Fall Restrictions Weight Bearing Restrictions: No    Mobility  Bed Mobility               General bed mobility comments: deferred due to vitals and symptoms  Transfers                    Ambulation/Gait                 Stairs            Wheelchair Mobility    Modified Rankin (Stroke Patients Only)       Balance                                            Cognition Arousal/Alertness: Awake/alert Behavior During Therapy: WFL for tasks assessed/performed Overall Cognitive Status: History of cognitive impairments - at baseline                                        Exercises General Exercises - Lower Extremity Ankle Circles/Pumps: AROM;Both;20  reps;Supine Quad Sets: Strengthening;Both;20 reps;Supine Gluteal Sets: Strengthening;Both;20 reps;Supine Heel Slides: AROM;Both;10 reps;Supine Hip ABduction/ADduction: AROM;Both;10 reps;Supine    General Comments        Pertinent Vitals/Pain Pain Assessment: 0-10 Pain Score: 10-Worst pain ever Pain Location: L rib cage/upper quadrant ant'r with no rhyme or reason when it comes. Comes and goes    Home Living                      Prior Function            PT Goals (current goals can now be found in the care plan section)      Frequency    Min 2X/week      PT Plan Current plan remains appropriate    Co-evaluation             End of Session Equipment Utilized During Treatment: Oxygen Activity Tolerance: Treatment limited secondary to medical complications (Comment) (HR increases to 115 and O2 sats decease to 79%) Patient left:  in bed;with call bell/phone within reach;with bed alarm set Nurse Communication: Other (comment) (vitals; session intolerance) PT Visit Diagnosis: Muscle weakness (generalized) (M62.81);Difficulty in walking, not elsewhere classified (R26.2)     Time: 1201-1219 PT Time Calculation (min) (ACUTE ONLY): 18 min  Charges:  $Therapeutic Exercise: 8-22 mins                    G Codes:        Larae Grooms, PTA 11/11/2016, 12:26 PM

## 2016-11-11 NOTE — Progress Notes (Signed)
Chaplain paged to complete an AD for a Pt in Rm116. [place] with Pt and her family at the bedside. Pt received education from [facility] yesterday and was ready to have the AD completed. [employer] secured 2 witnesses and Insurance account manager and had the HCPOA completed. CH gave Pt the original and 2 copies, and placed a copy in the Pt's medical file. Pt and family were appreciative of the chaplain's services. [place] informed the Pt and family that [facility] was available in needed.     11/11/16 1400  Clinical Encounter Type  Visited With Patient;Patient and family together  Visit Type Initial;ED;Other (Comment)  Referral From Chaplain;Nurse  Consult/Referral To Chaplain  Spiritual Encounters  Spiritual Needs Literature;Prayer;Other (Comment)

## 2016-11-11 NOTE — Plan of Care (Signed)
Problem: Respiratory: Goal: Respiratory status will improve Outcome: Not Progressing Pt still on HFNC. Physical therapy unable to work with her due to oxygen saturations declining while up.

## 2016-11-11 NOTE — Progress Notes (Signed)
Follow up Pulmonary  Pleasant. No distress on HFNC @ 85%. Denies pain. Cognition intact  Vitals:   11/11/16 0249 11/11/16 0402 11/11/16 0742 11/11/16 0937  BP:  (!) 144/60  (!) 180/68  Pulse:  80  (!) 101  Resp:  20    Temp:  97.7 F (36.5 C)    TempSrc:  Oral    SpO2: 92% 97% 91%   Weight:      Height:       HEENT WNL No JVD noted B crackles, bronchial quality in LLL, no wheezes Reg, no M NABS, soft No edema  BMP Latest Ref Rng & Units 11/11/2016 11/10/2016 11/08/2016  Glucose 65 - 99 mg/dL 151(H) 159(H) 117(H)  BUN 6 - 20 mg/dL 38(H) 33(H) 19  Creatinine 0.44 - 1.00 mg/dL 1.16(H) 1.11(H) 1.72(H)  Sodium 135 - 145 mmol/L 140 139 137  Potassium 3.5 - 5.1 mmol/L 4.6 4.8 3.2(L)  Chloride 101 - 111 mmol/L 106 107 104  CO2 22 - 32 mmol/L '27 25 27  '$ Calcium 8.9 - 10.3 mg/dL 8.9 9.1 7.9(L)   CBC Latest Ref Rng & Units 11/08/2016 11/07/2016 11/07/2016  WBC 3.6 - 11.0 K/uL 9.7 8.5 7.0  Hemoglobin 12.0 - 16.0 g/dL 10.5(L) 10.5(L) 11.2(L)  Hematocrit 35.0 - 47.0 % 32.2(L) 32.6(L) 34.5(L)  Platelets 150 - 440 K/uL 299 266 283   CXR (11/10/16): underlying pulm fibrosis pattern with LLL opacity likely representing tumor, atelectasis and effusion  CT chest (03/226/18): mild emphysema, moderate B pulmonary fibrosis, diffuse B ground glass opacities, LLL mass and volume loss, small L>R effusions, no PE  IMPRESSION: Acute on chronic hypoxemic respiratory failure LLL sq cell ca of lung diagnosed 02/2016, s/p XRT Pulmonary fibrosis, NOS Mild emphysema (former smoker) Acute pneumonitis pattern - likely radiation induced LLL atelectasis Small bilateral effusions - not likely contributing much to hypoxemia L sided chest discomfort - likely cancer related Mild PAH due to all of the above Previous relative intolerance to corticosteroids    DISCUSSION: I reviewed all of the relevant images with the patient and her family. I explained that beyond her current therapies - abx for atypical  infections (azithro), steroids for possible pneumonitis and oxygen therapy - there are no specific therapies further that can be offered. She is DNR and understands the poor prognosis. I assured her that we would not let her suffer and indicated that we would provide palliation for pain or dyspnea as needed.  PLAN/REC: 1) Continue empiric azithromycin - complete 5 day course 2) Continue empiric steroids - would continue current dose for total of 5 days then transition to prednisone 60 mg daily with slow taper as dictated by clinical response to 10-20 mg daily 3) Continue supplemental O2 and wean as able keeping SpO2 > 88% 4) We discussed options after discharge and I indicated that I think that she will need to have Hospice follow up. Consider Palliative Care consultation to assist in that transition   Please call if I can be of further assistance   Merton Border, MD PCCM service Mobile [phone] Pager [phone] 11/11/2016

## 2016-11-11 NOTE — Progress Notes (Signed)
[place] at [place] NAME: Melissa Palmer    MR#:  [id number]  DATE OF BIRTH:  1927-10-07  SUBJECTIVE:  CHIEF COMPLAINT:   Chief Complaint  Patient presents with  . Muscle Pain   - upset about her bedpan. Still has left sided 'grabbing' chest pain - remains on high flow nasal cannula with 82% fio2  REVIEW OF SYSTEMS:  Review of Systems  Constitutional: Negative for chills, fever and malaise/fatigue.  HENT: Negative for congestion, ear discharge, hearing loss and nosebleeds.   Eyes: Negative for blurred vision and double vision.  Respiratory: Positive for shortness of breath. Negative for cough and sputum production.   Cardiovascular: Positive for chest pain. Negative for leg swelling.  Gastrointestinal: Negative for abdominal pain, constipation, diarrhea, heartburn and vomiting.  Genitourinary: Negative for dysuria.  Musculoskeletal: Positive for myalgias. Negative for back pain.  Neurological: Positive for weakness. Negative for dizziness, sensory change, speech change, focal weakness and seizures.  Psychiatric/Behavioral: Negative for depression.    DRUG ALLERGIES:   Allergies  Allergen Reactions  . Asa [Aspirin] Anaphylaxis  . Flagyl [Metronidazole] Itching  . Hydrochlorothiazide Itching  . Sulfa Antibiotics Itching    VITALS:  Blood pressure (!) 180/68, pulse (!) 101, temperature 97.7 F (36.5 C), temperature source Oral, resp. rate 20, height '5\' 4"'$  (1.626 m), weight 61.8 kg (136 lb 3.2 oz), SpO2 91 %.  PHYSICAL EXAMINATION:  Physical Exam  GENERAL:  83 y.o.-year-old patient lying in the bed with no acute distress.  EYES: Pupils equal, round, reactive to light and accommodation. No scleral icterus. Extraocular muscles intact.  HEENT: Head atraumatic, normocephalic. Oropharynx and nasopharynx clear. On high flow nasal cannula NECK:  Supple, no jugular venous distention. No thyroid enlargement, no tenderness.  LUNGS:  Normal breath sounds bilaterally, no wheezing. Bibasilar crackles noted.  No use of accessory muscles of respiration. decreased breath sounds at the bases CARDIOVASCULAR: S1, S2 normal. No  rubs, or gallops. 3/6 systolic murmur present ABDOMEN: Soft, nontender, nondistended. Bowel sounds present. No organomegaly or mass.  EXTREMITIES: No pedal edema, cyanosis, or clubbing.  NEUROLOGIC: Cranial nerves II through XII are intact. Muscle strength 5/5 in all extremities. Sensation intact. Gait not checked. Global weakness noted. PSYCHIATRIC: The patient is alert and oriented x 2-3.  SKIN: No obvious rash, lesion, or ulcer.    LABORATORY PANEL:   CBC  Recent Labs Lab 11/08/16 0513  WBC 9.7  HGB 10.5*  HCT 32.2*  PLT 299   ------------------------------------------------------------------------------------------------------------------  Chemistries   Recent Labs Lab 10/20/2016 0732  11/11/16 0433  NA 136  < > 140  K 3.7  < > 4.6  CL 103  < > 106  CO2 26  < > 27  GLUCOSE 110*  < > 151*  BUN 16  < > 38*  CREATININE 1.36*  < > 1.16*  CALCIUM 8.8*  < > 8.9  AST 19  --   --   ALT 26  --   --   ALKPHOS 42  --   --   BILITOT 0.6  --   --   < > = values in this interval not displayed. ------------------------------------------------------------------------------------------------------------------  Cardiac Enzymes  Recent Labs Lab 11/08/16 0513  TROPONINI 0.03*   ------------------------------------------------------------------------------------------------------------------  RADIOLOGY:  Dg Chest Port 1 View  Result Date: 11/10/2016 CLINICAL DATA:  Shortness of breath and chest pain EXAM: PORTABLE CHEST 1 VIEW COMPARISON:  November 08, 2016 chest radiograph and chest CT November 06, 2016 FINDINGS: There is persistent consolidation in the left lower lobe with left pleural effusion. There is underlying diffuse fibrotic type change. There may well be superimposed interstitial edema in  this area as well. Heart is upper normal in size with pulmonary vascularity within normal limits. Pacemaker leads are attached to the right atrium and right ventricle. There is atherosclerotic calcification in the aorta. There are surgical clips over the right breast. IMPRESSION: Persistent airspace consolidation left base with left pleural effusion. Suspect pneumonia in this area; underlying mass in this area cannot be excluded. Widespread pulmonary fibrosis remains. There may be a degree of superimposed interstitial edema. The overall appearance is stable compared to recent prior studies. Cardiac silhouette is unchanged.  There is aortic atherosclerosis. Electronically Signed   By: Lowella Grip III M.D.   On: 11/10/2016 08:12    EKG:   Orders placed or performed during the hospital encounter of 11/11/2016  . ED EKG  . ED EKG  . EKG 12-Lead  . EKG 12-Lead  . EKG 12-Lead  . 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  #1 acute hypoxic respiratory failure- Not on home oxygen -Acute decompensation after physical therapy, started on high flow nasal cannula.Currently flow rate of 40 L, FiO2 of 82% -Secondary to radiation pneumonitis and ? Pulmonary fibrosis. LLL pneumonia -Pulmonary consulted. Nebs as needed. High-dose steroids added. - possible PNA cannot be excluded- on zosyn and azithromycin.  - once pain is improved, encourage incentive spirometry - requested Dr. Alva Garnet to see the patient in consultation for pulmonary as no further follow up noted from Dr. Raul Del  #2 Sepsis- UTI and PNA - on zosyn, the cultures are negative. Urine cultures growing Proteus mirabilis which is sensitive to most  antibiotics -We will narrow antibiotics in the next day or 2 depending upon her clinical condition.  #2 left-sided chest pain-secondary to possible radiation pneumonitis. CT of the chest on admission 2 days ago with no further pneumonia or pulmonary embolism noted. Repeat chest x-rays in the last 2 days without any acute changes. -Troponins have remained negative. -Continue pain medications as needed. Lidoderm patch place.  -increased the dose of fentanyl patch today.  #3 history of atrial fibrillation-rate controlled, on Cardizem orally. -On eliquis for anticoagulation -Trouble with pacemaker, cardiology consulted to interrogate pacemaker. Echocardiogram normal with EF 27%, diastolic dysfunction noted, mildly elevated PA pressures noted - one dose lasix given today  #4 left lung cancer-squamous cell carcinoma of left lung base, status post radiation, finished in 2017 October. -Currently not on any chemotherapy actively. Oncology consulted  #5 hypertension-On clonidine and lisinopril and Cardizem  #6 DVT prophylaxis-patient already on eliquis    Physical Therapy consulted. Updated daughters at bedside   All the records are reviewed and case discussed with Care Management/Social Workerr. Management plans discussed with the patient, family and they are in agreement.  CODE STATUS: DNR  TOTAL TIME TAKING CARE OF THIS PATIENT: 38 minutes.   POSSIBLE D/C IN 2-3 DAYS, DEPENDING ON CLINICAL CONDITION.   Gladstone Lighter M.D on 11/11/2016 at 10:55 AM  Between 7am to 6pm - Pager - [phone]  After 6pm go to [email or web address] - Proofreader  [employer]  [phone]  CC: Primary care physician; Denita Lung, DO

## 2016-11-12 LAB — CBC
HCT: 32.2 % — ABNORMAL LOW (ref 35.0–47.0)
HEMOGLOBIN: 10.4 g/dL — AB (ref 12.0–16.0)
MCH: 24.1 pg — ABNORMAL LOW (ref 26.0–34.0)
MCHC: 32.3 g/dL (ref 32.0–36.0)
MCV: 74.7 fL — AB (ref 80.0–100.0)
Platelets: 447 10*3/uL — ABNORMAL HIGH (ref 150–440)
RBC: 4.31 MIL/uL (ref 3.80–5.20)
RDW: 24.9 % — AB (ref 11.5–14.5)
WBC: 7.5 10*3/uL (ref 3.6–11.0)

## 2016-11-12 LAB — BASIC METABOLIC PANEL
Anion gap: 8 (ref 5–15)
BUN: 52 mg/dL — ABNORMAL HIGH (ref 6–20)
CALCIUM: 8.5 mg/dL — AB (ref 8.9–10.3)
CHLORIDE: 104 mmol/L (ref 101–111)
CO2: 29 mmol/L (ref 22–32)
CREATININE: 1.4 mg/dL — AB (ref 0.44–1.00)
GFR calc non Af Amer: 33 mL/min — ABNORMAL LOW (ref >60)
GFR, EST AFRICAN AMERICAN: 38 mL/min — AB (ref >60)
GLUCOSE: 157 mg/dL — AB (ref 65–99)
Potassium: 4 mmol/L (ref 3.5–5.1)
Sodium: 141 mmol/L (ref 135–145)

## 2016-11-12 MED ORDER — SODIUM CHLORIDE 0.9 % IV SOLN
3.0000 g | Freq: Two times a day (BID) | INTRAVENOUS | Status: DC
  Administered 2016-11-12 – 2016-11-13 (×2): 3 g via INTRAVENOUS
  Filled 2016-11-12 (×4): qty 3

## 2016-11-12 NOTE — Clinical Social Work Note (Signed)
Clinical Social Work Assessment  Patient Details  Name: Melissa Palmer MRN: [id number] Date of Birth: 04/21/1928  Date of referral:  11/12/16               Reason for consult:  Facility Placement, End of Life/Hospice                Permission sought to share information with:  Facility Art therapist granted to share information::     Name::        Agency::     Relationship::     Contact Information:     Housing/Transportation Living arrangements for the past 2 months:  Single Family Home Source of Information:  Adult Children Patient Interpreter Needed:  None Criminal Activity/Legal Involvement Pertinent to Current Situation/Hospitalization:  No - Comment as needed Significant Relationships:  Adult Children Lives with:  Adult Children Do you feel safe going back to the place where you live?  Yes Need for family participation in patient care:  Yes (Comment)  Care giving concerns:  PT rec for SNF, MD rec for [place] Worker assessment / plan:  CSW contacted the patient's daughter to discuss dc preferences. The patient's daughter reported that the family is still discussing the options and that they seem to be leaning towards Hospice Home. The patient's daughter wishes to have the palliative care consult completed before making a decision. The CSW offered emotional support and information about Hospice coordination.  Employment status:  Retired Nurse, adult PT Recommendations:  [place] / Referral to community resources:  [place]  Patient/Family's Response to care:  The patient's daughter thanked the CSW for assistance.  Patient/Family's Understanding of and Emotional Response to Diagnosis, Current Treatment, and Prognosis:  The patient's daughter understands that the patient's prognosis is poor and is appropriately making end of life decisions.  Emotional  Assessment Appearance:  Appears stated age Attitude/Demeanor/Rapport:   (Patient was sleeping.) Affect (typically observed):   (Patient was sleeping.) Orientation:   (Patient was sleeping.) Alcohol / Substance use:  Never Used Psych involvement (Current and /or in the community):     Discharge Needs  Concerns to be addressed:  Grief and Loss Concerns, Discharge Planning Concerns, Care Coordination Readmission within the last 30 days:  No Current discharge risk:  Terminally ill Barriers to Discharge:  Other (Family wishes to have palliative consult completed before deciding between SNF and Hospice.)   Zettie Pho, LCSW 11/12/2016, 9:16 AM

## 2016-11-12 NOTE — Progress Notes (Signed)
[place] at [place] NAME: Melissa Palmer    MR#:  [id number]  DATE OF BIRTH:  June 08, 1928  SUBJECTIVE:  CHIEF COMPLAINT:   Chief Complaint  Patient presents with  . Muscle Pain   - Resting well this morning. Has had significant left-sided chest pain last night relieved with IV pain medications. -Daughter at bedside. Remains on high flow nasal cannula.  REVIEW OF SYSTEMS:  Review of Systems  Constitutional: Negative for chills, fever and malaise/fatigue.  HENT: Negative for congestion, ear discharge, hearing loss and nosebleeds.   Eyes: Negative for blurred vision and double vision.  Respiratory: Positive for shortness of breath. Negative for cough and sputum production.   Cardiovascular: Positive for chest pain. Negative for leg swelling.  Gastrointestinal: Negative for abdominal pain, constipation, diarrhea, heartburn and vomiting.  Genitourinary: Negative for dysuria.  Musculoskeletal: Positive for myalgias. Negative for back pain.  Neurological: Positive for weakness. Negative for dizziness, sensory change, speech change, focal weakness and seizures.  Psychiatric/Behavioral: Negative for depression.    DRUG ALLERGIES:   Allergies  Allergen Reactions  . Asa [Aspirin] Anaphylaxis  . Flagyl [Metronidazole] Itching  . Hydrochlorothiazide Itching  . Sulfa Antibiotics Itching    VITALS:  Blood pressure (!) 158/59, pulse 83, temperature 97.6 F (36.4 C), temperature source Axillary, resp. rate (!) 22, height '5\' 4"'$  (1.626 m), weight 61.8 kg (136 lb 3.2 oz), SpO2 92 %.  PHYSICAL EXAMINATION:  Physical Exam  GENERAL:  83 y.o.-year-old patient lying in the bed with no acute distress. Resting today EYES: Pupils equal, round, reactive to light and accommodation. No scleral icterus. Extraocular muscles intact.  HEENT: Head atraumatic, normocephalic. Oropharynx and nasopharynx clear. On high flow nasal cannula NECK:  Supple, no jugular  venous distention. No thyroid enlargement, no tenderness.  LUNGS: Normal breath sounds bilaterally, no wheezing. Bibasilar crackles noted.  No use of accessory muscles of respiration. decreased breath sounds at the bases CARDIOVASCULAR: S1, S2 normal. No  rubs, or gallops. 3/6 systolic murmur present ABDOMEN: Soft, nontender, nondistended. Bowel sounds present. No organomegaly or mass.  EXTREMITIES: No pedal edema, cyanosis, or clubbing.  NEUROLOGIC: Cranial nerves II through XII are intact. Muscle strength 5/5 in all extremities. Sensation intact. Gait not checked. Global weakness noted. PSYCHIATRIC: The patient is resting today, when awakened, is alert and oriented x 2-3.  SKIN: No obvious rash, lesion, or ulcer.    LABORATORY PANEL:   CBC  Recent Labs Lab 11/12/16 0441  WBC 7.5  HGB 10.4*  HCT 32.2*  PLT 447*   ------------------------------------------------------------------------------------------------------------------  Chemistries   Recent Labs Lab 10/27/2016 0732  11/12/16 0441  NA 136  < > 141  K 3.7  < > 4.0  CL 103  < > 104  CO2 26  < > 29  GLUCOSE 110*  < > 157*  BUN 16  < > 52*  CREATININE 1.36*  < > 1.40*  CALCIUM 8.8*  < > 8.5*  AST 19  --   --   ALT 26  --   --   ALKPHOS 42  --   --   BILITOT 0.6  --   --   < > = values in this interval not displayed. ------------------------------------------------------------------------------------------------------------------  Cardiac Enzymes  Recent Labs Lab 11/08/16 0513  TROPONINI 0.03*   ------------------------------------------------------------------------------------------------------------------  RADIOLOGY:  No results found.  EKG:   Orders placed or performed during the hospital encounter of 10/29/2016  . ED EKG  . ED  EKG  . EKG 12-Lead  . 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  #1 acute hypoxic respiratory failure- Not on home oxygen -Acute decompensation after physical therapy, started on high flow nasal cannula.Currently flow rate of 40 L, FiO2 of 82% -Secondary to radiation pneumonitis and ? Pulmonary fibrosis. LLL pneumonia -Pulmonary consulted. Nebs as needed. High-dose steroids added. Continue IV steroids for today, change to oral prednisone from tomorrow. - possible PNA cannot be excluded- on Unasyn and azithromycin.  - once pain is improved, encourage incentive spirometry -Appreciate pulmonary consultation by Dr. Alva Garnet  #2 Sepsis- UTI and PNA - on Unasyn, the cultures are negative. Urine cultures growing Proteus mirabilis which is sensitive to most antibiotics- stop Unasyn tomorrow  #2 left-sided chest pain-secondary to possible radiation pneumonitis. CT of the chest on admission 2 days ago with no further pneumonia or pulmonary embolism noted. Repeat chest x-rays in the last 2 days without any acute changes. Not sure if there is recurrence of cancer with a mild left pleural effusion as patient is in so much pain. Continue pain medications -Troponins have remained negative. -Continue pain medications as needed. Lidoderm patch place.  -increased the dose of fentanyl patch.  #3 history of atrial fibrillation-rate controlled, on Cardizem orally. -On eliquis for anticoagulation -Trouble with pacemaker, cardiology consulted to interrogate pacemaker. Echocardiogram normal with EF 12%, diastolic dysfunction noted, mildly elevated PA pressures noted  #4 left lung cancer-squamous cell carcinoma of left lung base, status post radiation, finished in 2017 October. -Currently not on any chemotherapy actively. Oncology consulted -Has some left pleural effusion, possible  recurrence of cancer cannot be ruled out  #5 hypertension-On clonidine and lisinopril and Cardizem  #6 DVT prophylaxis-patient already on eliquis    Unable to participate with physical therapy due to significant hypoxia and pain. -Palliative care consultation tomorrow.  Spoke with one of the daughters at bedside, recommended hospice at this time. We'll check with her oncologist tomorrow. Overall poor prognosis   All the records are reviewed and case discussed with Care Management/Social Workerr. Management plans discussed with the patient, family and they are in agreement.  CODE STATUS: DNR  TOTAL TIME TAKING CARE OF THIS PATIENT: 36 minutes.   POSSIBLE D/C IN 1-2 DAYS, DEPENDING ON CLINICAL CONDITION.   Gladstone Lighter M.D on 11/12/2016 at 8:25 AM  Between 7am to 6pm - Pager - [phone]  After 6pm go to [email or web address] - password [email or web address] [place] [facility]  Office  [phone]  CC: Primary care physician; Denita Lung, DO

## 2016-11-12 NOTE — Progress Notes (Signed)
Pharmacy Antibiotic Note  Melissa Palmer is a 83 y.o. female admitted on 11/10/2016 with pneumonia and UTI.  Pharmacy has been consulted for Unasyn dosing. Patient was on Zosyn from 3/27-3/30. Patient is also receiving Azithromycin '500mg'$  daily.   Plan: Will adjust Unasyn dosing to 3 g iv q 12 hours for renal function.   3/26 levofloxacin x 1 dose 3/27: Vanc>>3/27 3/27 Zosyn >>  3/30 3/29 Azithromycin >> 3/30 Unasyn >>  Height: '5\' 4"'$  (162.6 cm) Weight: 136 lb 3.2 oz (61.8 kg) IBW/kg (Calculated) : 54.7  Temp (24hrs), Avg:97.7 F (36.5 C), Min:97.5 F (36.4 C), Max:98 F (36.7 C)   Recent Labs Lab 11/05/2016 0732 11/07/16 0138 11/07/16 2356 11/08/16 0513 11/10/16 0451 11/11/16 0433 11/12/16 0441  WBC 7.0 8.5  --  9.7  --   --  7.5  CREATININE 1.36* 1.37*  --  1.72* 1.11* 1.16* 1.40*  LATICACIDVEN  --  0.8 0.9  --   --   --   --     Estimated Creatinine Clearance: 24.9 mL/min (A) (by C-G formula based on SCr of 1.4 mg/dL (H)).    Allergies  Allergen Reactions  . Asa [Aspirin] Anaphylaxis  . Flagyl [Metronidazole] Itching  . Hydrochlorothiazide Itching  . Sulfa Antibiotics Itching    Thank you for allowing pharmacy to be a part of this patient's care.  Napoleon Form, PharmD, BCPS Clinical Pharmacist 11/12/2016 11:02 AM

## 2016-11-12 DEATH — deceased

## 2016-11-13 DIAGNOSIS — C3492 Malignant neoplasm of unspecified part of left bronchus or lung: Secondary | ICD-10-CM

## 2016-11-13 DIAGNOSIS — Z515 Encounter for palliative care: Secondary | ICD-10-CM

## 2016-11-13 DIAGNOSIS — Z7189 Other specified counseling: Secondary | ICD-10-CM

## 2016-11-13 LAB — BASIC METABOLIC PANEL
Anion gap: 7 (ref 5–15)
BUN: 59 mg/dL — ABNORMAL HIGH (ref 6–20)
CO2: 30 mmol/L (ref 22–32)
CREATININE: 1.11 mg/dL — AB (ref 0.44–1.00)
Calcium: 8.6 mg/dL — ABNORMAL LOW (ref 8.9–10.3)
Chloride: 106 mmol/L (ref 101–111)
GFR calc Af Amer: 51 mL/min — ABNORMAL LOW (ref >60)
GFR, EST NON AFRICAN AMERICAN: 44 mL/min — AB (ref >60)
GLUCOSE: 191 mg/dL — AB (ref 65–99)
Potassium: 4.1 mmol/L (ref 3.5–5.1)
Sodium: 143 mmol/L (ref 135–145)

## 2016-11-13 MED ORDER — LORAZEPAM 2 MG/ML PO CONC: 0.5000 mg | ORAL | Status: DC | PRN

## 2016-11-13 MED ORDER — LORAZEPAM 2 MG/ML IJ SOLN: 0.5000 mg | INTRAMUSCULAR | Status: DC | PRN

## 2016-11-13 MED ORDER — HALOPERIDOL 0.5 MG PO TABS: 0.5000 mg | ORAL_TABLET | ORAL | 0 refills | Status: AC | PRN

## 2016-11-13 MED ORDER — LIDOCAINE 5 % EX PTCH: 1.0000 | MEDICATED_PATCH | CUTANEOUS | 0 refills | Status: AC

## 2016-11-13 MED ORDER — CLONIDINE HCL 0.1 MG PO TABS: 0.1000 mg | ORAL_TABLET | Freq: Two times a day (BID) | ORAL | 11 refills | Status: AC

## 2016-11-13 MED ORDER — HALOPERIDOL LACTATE 2 MG/ML PO CONC
0.5000 mg | ORAL | Status: DC | PRN
  Filled 2016-11-13: qty 0.3

## 2016-11-13 MED ORDER — GLYCOPYRROLATE 1 MG PO TABS: 1.0000 mg | ORAL_TABLET | ORAL | 0 refills | Status: AC | PRN

## 2016-11-13 MED ORDER — LORAZEPAM 2 MG/ML PO CONC: 1.0000 mg | ORAL | Status: DC | PRN

## 2016-11-13 MED ORDER — MORPHINE SULFATE (PF) 2 MG/ML IV SOLN: 1.0000 mg | INTRAVENOUS | Status: DC | PRN

## 2016-11-13 MED ORDER — BIOTENE DRY MOUTH MT LIQD: 15.0000 mL | OROMUCOSAL | Status: DC | PRN

## 2016-11-13 MED ORDER — LORAZEPAM 1 MG PO TABS: 1.0000 mg | ORAL_TABLET | ORAL | Status: DC | PRN

## 2016-11-13 MED ORDER — HALOPERIDOL LACTATE 5 MG/ML IJ SOLN: 0.5000 mg | INTRAMUSCULAR | Status: DC | PRN

## 2016-11-13 MED ORDER — GLYCOPYRROLATE 0.2 MG/ML IJ SOLN
0.2000 mg | INTRAMUSCULAR | Status: DC | PRN
  Administered 2016-11-15: 09:00:00 0.2 mg via INTRAVENOUS
  Filled 2016-11-13: qty 1

## 2016-11-13 MED ORDER — GLYCOPYRROLATE 1 MG PO TABS: 1.0000 mg | ORAL_TABLET | ORAL | Status: DC | PRN

## 2016-11-13 MED ORDER — FENTANYL 25 MCG/HR TD PT72: 25.0000 ug | MEDICATED_PATCH | TRANSDERMAL | 0 refills | Status: AC

## 2016-11-13 MED ORDER — HALOPERIDOL 0.5 MG PO TABS: 0.5000 mg | ORAL_TABLET | ORAL | Status: DC | PRN

## 2016-11-13 MED ORDER — LORAZEPAM 2 MG/ML IJ SOLN: 1.0000 mg | INTRAMUSCULAR | Status: DC | PRN

## 2016-11-13 MED ORDER — LORAZEPAM 0.5 MG PO TABS: 0.5000 mg | ORAL_TABLET | Freq: Three times a day (TID) | ORAL | 0 refills | Status: AC | PRN

## 2016-11-13 MED ORDER — POLYVINYL ALCOHOL 1.4 % OP SOLN
1.0000 [drp] | Freq: Four times a day (QID) | OPHTHALMIC | Status: DC | PRN
  Filled 2016-11-13: qty 15

## 2016-11-13 MED ORDER — MORPHINE SULFATE (CONCENTRATE) 10 MG/0.5ML PO SOLN
10.0000 mg | ORAL | Status: DC | PRN
  Administered 2016-11-13 – 2016-11-14 (×3): 10 mg via ORAL
  Filled 2016-11-13 (×3): qty 1

## 2016-11-13 MED ORDER — GLYCOPYRROLATE 0.2 MG/ML IJ SOLN: 0.2000 mg | INTRAMUSCULAR | Status: DC | PRN

## 2016-11-13 MED ORDER — LORAZEPAM 0.5 MG PO TABS
0.5000 mg | ORAL_TABLET | ORAL | Status: DC | PRN
  Administered 2016-11-13: 0.5 mg via ORAL
  Filled 2016-11-13: qty 1

## 2016-11-13 MED ORDER — MORPHINE SULFATE (CONCENTRATE) 10 MG/0.5ML PO SOLN: 10.0000 mg | ORAL | 0 refills | Status: AC | PRN

## 2016-11-13 MED ORDER — PREDNISONE 10 MG (21) PO TBPK: ORAL_TABLET | ORAL | 0 refills | Status: AC

## 2016-11-13 MED ORDER — IPRATROPIUM-ALBUTEROL 0.5-2.5 (3) MG/3ML IN SOLN: 3.0000 mL | RESPIRATORY_TRACT | Status: DC | PRN

## 2016-11-13 NOTE — Progress Notes (Signed)
[place] NOTE  Patient Care Team: Denita Lung, DO as PCP - General (Internal Medicine)  CHIEF COMPLAINTS/PURPOSE OF CONSULTATION: Lung cancer; worsening respiratory distress  CC: Please note patient is drowsy/sleeping family by the bedside. Patient continues to have difficulty breathing; unable to wean her off high flow nasal oxygen. Family concerned regarding the clinical deterioration/lack of improvement.  Review of system- cannot be obtained.   MEDICAL HISTORY:  Past Medical History:  Diagnosis Date  . A-fib ([place])   . Cancer ([place])    lung ca  . Cataract   . Dementia   . Hypertension   . Hypertension   . TIA (transient ischemic attack)     SURGICAL HISTORY: Past Surgical History:  Procedure Laterality Date  . BREAST SURGERY    . COLONOSCOPY    . HYSTEROTOMY    . UPPER GI ENDOSCOPY      SOCIAL HISTORY: Social History   Social History  . Marital status: Married    Spouse name: N/A  . Number of children: N/A  . Years of education: N/A   Occupational History  . Not on file.   Social History Main Topics  . Smoking status: Former Research scientist (life sciences)  . Smokeless tobacco: Never Used  . Alcohol use No  . Drug use: No  . Sexual activity: Not on file   Other Topics Concern  . Not on file   Social History Narrative  . No narrative on file    FAMILY HISTORY: History reviewed. No pertinent family history.  ALLERGIES:  is allergic to asa [aspirin]; flagyl [metronidazole]; hydrochlorothiazide; and sulfa antibiotics.  MEDICATIONS:  Current Facility-Administered Medications  Medication Dose Route Frequency Provider Last Rate Last Dose  . acetaminophen (TYLENOL) tablet 650 mg  650 mg Oral Q6H PRN Demetrios Loll, MD   650 mg at 11/07/16 1751  . antiseptic oral rinse (BIOTENE) solution 15 mL  15 mL Topical PRN Melton Alar, PA-C      . chlorhexidine (PERIDEX) 0.12 % solution 15 mL  15 mL Mouth Rinse BID Gladstone Lighter, MD   15 mL at 11/13/16 0955  .  cloNIDine (CATAPRES) tablet 0.1 mg  0.1 mg Oral BID Gladstone Lighter, MD   0.1 mg at 11/13/16 0955  . diltiazem (CARDIZEM CD) 24 hr capsule 360 mg  360 mg Oral Daily Demetrios Loll, MD   360 mg at 11/13/16 0956  . docusate sodium (COLACE) capsule 100 mg  100 mg Oral BID Demetrios Loll, MD   100 mg at 11/13/16 0955  . fentaNYL (DURAGESIC - dosed mcg/hr) patch 25 mcg  25 mcg Transdermal Q72H Gladstone Lighter, MD   25 mcg at 11/13/16 0955  . fluvoxaMINE (LUVOX) tablet 25 mg  25 mg Oral Q1200 Demetrios Loll, MD   25 mg at 11/13/16 1300  . glycopyrrolate (ROBINUL) tablet 1 mg  1 mg Oral Q4H PRN Melton Alar, PA-C       Or  . glycopyrrolate (ROBINUL) injection 0.2 mg  0.2 mg Subcutaneous Q4H PRN Melton Alar, PA-C       Or  . glycopyrrolate (ROBINUL) injection 0.2 mg  0.2 mg Intravenous Q4H PRN Melton Alar, PA-C      . guaiFENesin (ROBITUSSIN) 100 MG/5ML solution 100 mg  5 mL Oral Q4H PRN Demetrios Loll, MD      . haloperidol (HALDOL) tablet 0.5 mg  0.5 mg Oral Q4H PRN Melton Alar, PA-C       Or  . haloperidol (HALDOL)  2 MG/ML solution 0.5 mg  0.5 mg Sublingual Q4H PRN Melton Alar, PA-C       Or  . haloperidol lactate (HALDOL) injection 0.5 mg  0.5 mg Intravenous Q4H PRN Melton Alar, PA-C      . ipratropium-albuterol (DUONEB) 0.5-2.5 (3) MG/3ML nebulizer solution 3 mL  3 mL Nebulization Q2H PRN Melton Alar, PA-C      . labetalol (NORMODYNE,TRANDATE) injection 10 mg  10 mg Intravenous Q2H PRN Lance Coon, MD   10 mg at 11/11/16 1402  . lidocaine (LIDODERM) 5 % 1 patch  1 patch Transdermal Q24H Gladstone Lighter, MD   1 patch at 11/13/16 0956  . lisinopril (PRINIVIL,ZESTRIL) tablet 10 mg  10 mg Oral BID Gladstone Lighter, MD   10 mg at 11/13/16 0955  . loratadine (CLARITIN) tablet 10 mg  10 mg Oral Daily Demetrios Loll, MD   10 mg at 11/13/16 0955  . LORazepam (ATIVAN) tablet 0.5 mg  0.5 mg Oral Q4H PRN Melton Alar, PA-C       Or  . LORazepam (ATIVAN) 2 MG/ML concentrated solution 0.5 mg  0.5  mg Sublingual Q4H PRN Melton Alar, PA-C       Or  . LORazepam (ATIVAN) injection 0.5 mg  0.5 mg Intravenous Q4H PRN Melton Alar, PA-C      . MEDLINE mouth rinse  15 mL Mouth Rinse q12n4p Gladstone Lighter, MD   15 mL at 11/12/16 1650  . morphine 2 MG/ML injection 1-4 mg  1-4 mg Intravenous Q1H PRN Bobby Rumpf [place], PA-C      . morphine CONCENTRATE 10 MG/0.5ML oral solution 10 mg  10 mg Oral Q2H PRN Gladstone Lighter, MD      . ondansetron (ZOFRAN) injection 4 mg  4 mg Intravenous Q6H PRN Demetrios Loll, MD   4 mg at 11/12/16 1410  . polyvinyl alcohol (LIQUIFILM TEARS) 1.4 % ophthalmic solution 1 drop  1 drop Both Eyes QID PRN Melton Alar, PA-C      . senna-docusate (Senokot-S) tablet 1 tablet  1 tablet Oral QHS PRN Demetrios Loll, MD          .  PHYSICAL EXAMINATION:  Vitals:   11/13/16 0542 11/13/16 1259  BP: (!) 173/65 (!) 176/59  Pulse: 81 86  Resp:  20  Temp:  98 F (36.7 C)   Filed Weights   11/04/2016 0659 10/12/2016 1311  Weight: 136 lb (61.7 kg) 136 lb 3.2 oz (61.8 kg)    GENERAL: Frail-appearing Caucasian female patient, drowsy no distress and comfortable. She is accompanied by fmaily on  HF [place].  EYES: no pallor or icterus OROPHARYNX: no thrush or ulceration. NECK: supple, no masses felt LYMPH:  no palpable lymphadenopathy in the cervical, axillary or inguinal regions LUNGS: decreased breath sounds to auscultation and no more than right.  HEART/CVS: regular rate & rhythm and no murmurs; No lower extremity edema ABDOMEN: abdomen soft, non-tender and normal bowel sounds Musculoskeletal:no cyanosis of digits and no clubbing  PSYCH: sleepy/drowsy NEURO: no focal motor/sensory deficits SKIN:  no rashes or significant lesions  LABORATORY DATA:  I have reviewed the data as listed Lab Results  Component Value Date   WBC 7.5 11/12/2016   HGB 10.4 (L) 11/12/2016   HCT 32.2 (L) 11/12/2016   MCV 74.7 (L) 11/12/2016   PLT 447 (H) 11/12/2016    Recent Labs   04/25/16 1539 09/19/16 0934 10/21/2016 0732  11/11/16 0433 11/12/16 0441 11/13/16 0436  NA  135 135 136  < > 140 141 143  K 4.0 3.7 3.7  < > 4.6 4.0 4.1  CL 104 99* 103  < > 106 104 106  CO2 '24 26 26  '$ < > '27 29 30  '$ GLUCOSE 101* 117* 110*  < > 151* 157* 191*  BUN '19 15 16  '$ < > 38* 52* 59*  CREATININE 1.30* 1.17* 1.36*  < > 1.16* 1.40* 1.11*  CALCIUM 9.1 9.3 8.8*  < > 8.9 8.5* 8.6*  GFRNONAA 36* 41* 34*  < > 41* 33* 44*  GFRAA 42* 47* 40*  < > 48* 38* 51*  PROT 7.6 8.4* 5.9*  --   --   --   --   ALBUMIN 3.9 3.7 2.7*  --   --   --   --   AST '18 18 19  '$ --   --   --   --   ALT 9* 9* 26  --   --   --   --   ALKPHOS 66 78 42  --   --   --   --   BILITOT 0.3 0.3 0.6  --   --   --   --   < > = values in this interval not displayed.  RADIOGRAPHIC STUDIES: I have personally reviewed the radiological images as listed and agreed with the findings in the report. Ct Head Wo Contrast  Result Date: 10/14/2016 CLINICAL DATA:  Body aches and generalized weakness for 3 months. EXAM: CT HEAD WITHOUT CONTRAST TECHNIQUE: Contiguous axial images were obtained from the base of the skull through the vertex without intravenous contrast. COMPARISON:  Head CT scan 03/08/2016. FINDINGS: Brain: The brain is atrophic with fairly extensive chronic microvascular ischemic change. Remote right frontal infarct is noted. No acute abnormality including infarction, hemorrhage, mass lesion, mass effect, midline shift or abnormal extra-axial fluid collection. No hydrocephalus or pneumocephalus. Vascular: Atherosclerosis noted. Skull: Intact. Sinuses/Orbits: No acute abnormality. Other: None. IMPRESSION: No acute abnormality. Atrophy, chronic microvascular ischemic change remote right frontal infarct. Atherosclerosis. Electronically Signed   By: Inge Rise M.D.   On: 10/17/2016 11:10   Ct Angio Chest Pe W And/or Wo Contrast  Result Date: 10/19/2016 CLINICAL DATA:  Cough and chest pain for 1 week. Finished radiation therapy  in October for lung cancer. EXAM: CT ANGIOGRAPHY CHEST WITH CONTRAST TECHNIQUE: Multidetector CT imaging of the chest was performed using the standard protocol during bolus administration of intravenous contrast. Multiplanar CT image reconstructions and MIPs were obtained to evaluate the vascular anatomy. CONTRAST:  60 cc of Isovue 370 COMPARISON:  09/26/2016 FINDINGS: Cardiovascular: The quality of this exam for evaluation of pulmonary embolism is good. The only limitation is minimal motion inferiorly. No evidence of pulmonary embolism. Extensive colonic diverticulosis. Ulcerative plaque in the transverse aorta. Mild cardiomegaly, without pericardial effusion. Multivessel coronary artery atherosclerosis. Mediastinum/Nodes: A node within the azygoesophageal recess measures 12 mm on image 45/series 4 versus maximally 7 mm on the prior exam. No hilar adenopathy. Lungs/Pleura: Slight increase in small left pleural effusion. Trace right pleural fluid or thickening. Moderate centrilobular emphysema. 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 Basilar predominant subpleural reticulation with architectural distortion. Left lower lobe consolidation with only peripheral air bronchograms, increased. Underlying fiducials. Upper Abdomen: Normal imaged portions of the liver, spleen, stomach, pancreas, adrenal glands, kidneys. Musculoskeletal: Diminutive right fifth rib is likely developmental. Review of the MIP images confirms the above findings. IMPRESSION: 1.  No evidence of pulmonary embolism.  Minimal motion degradation. 2. Increase in left lower lobe consolidation with slight increase in small left pleural effusion. Increased consolidation could be due to evolving radiation change. Residual/recurrent disease cannot be excluded. 3. Developing adenopathy within the azygoesophageal recess station of the mediastinum. Suspicious for nodal metastasis. Reactive adenopathy could look similar. This could be  re-evaluated with short term follow-up CT or further characterized with PET. 4.  Coronary artery atherosclerosis. Aortic atherosclerosis. 5. 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 Electronically Signed   By: Abigail Miyamoto M.D.   On: 11/02/2016 09:22   Dg Chest Port 1 View  Result Date: 11/10/2016 CLINICAL DATA:  Shortness of breath and chest pain EXAM: PORTABLE CHEST 1 VIEW COMPARISON:  November 08, 2016 chest radiograph and chest CT November 06, 2016 FINDINGS: There is persistent consolidation in the left lower lobe with left pleural effusion. There is underlying diffuse fibrotic type change. There may well be superimposed interstitial edema in this area as well. Heart is upper normal in size with pulmonary vascularity within normal limits. Pacemaker leads are attached to the right atrium and right ventricle. There is atherosclerotic calcification in the aorta. There are surgical clips over the right breast. IMPRESSION: Persistent airspace consolidation left base with left pleural effusion. Suspect pneumonia in this area; underlying mass in this area cannot be excluded. Widespread pulmonary fibrosis remains. There may be a degree of superimposed interstitial edema. The overall appearance is stable compared to recent prior studies. Cardiac silhouette is unchanged.  There is aortic atherosclerosis. Electronically Signed   By: Lowella Grip III M.D.   On: 11/10/2016 08:12   Dg Chest Port 1 View  Result Date: 11/08/2016 CLINICAL DATA:  Pneumonia. EXAM: PORTABLE CHEST 1 VIEW COMPARISON:  11/07/2016 .  CT 10/17/2016. FINDINGS: Mediastinum is stable. Cardiac pacer in stable position. Heart size stable. Surgical clips noted over the lower chest. Persistent density is noted in the left lung base consistent with treatment changes and/or residual or recurrent tumor. Stable bilateral interstitial prominence. Pneumonitis cannot be excluded. Small bilateral pleural effusions. No pneumothorax.  IMPRESSION: 1. Persistent persistent density in the left lung base without interim change. This could represent treatment changes and/or residual/recurrent tumor. Pneumonia cannot be excluded. Stable bilateral interstitial prominence. Pneumonitis cannot be excluded. 2. Cardiac pacer stable position. Stable cardiomegaly . No pulmonary venous congestion. Small bilateral pleural effusions. Electronically Signed   By: Marcello Moores  Register   On: 11/08/2016 06:37   Dg Chest Port 1 View  Result Date: 11/07/2016 CLINICAL DATA:  Hypoxia. Left lower lobe lung cancer treated with radiation therapy in October. EXAM: PORTABLE CHEST 1 VIEW COMPARISON:  09/19/2016 chest radiograph. FINDINGS: Stable configuration of 2 lead right subclavian pacemaker. Fiducial markers overlie the left lower lung. Stable cardiomediastinal silhouette with top-normal heart size and aortic atherosclerosis. No pneumothorax. Stable small left pleural effusion. No right pleural effusion. No pulmonary edema. Patchy consolidation at the left lung base appears slightly worsened since the CT study from 1 day prior. Extensive reticular interstitial opacities at the lung bases appear unchanged. IMPRESSION: 1. Stable small left pleural effusion. 2. Slight worsening of patchy left lung base consolidation, which could represent progression of radiation pneumonitis and/or pneumonia superimposed on tumor. 3. Extensive underlying bibasilar reticular interstitial opacities, suspect underlying interstitial lung disease. 4. Aortic atherosclerosis. Electronically Signed   By: Ilona Sorrel M.D.   On: 11/07/2016 18:22    ASSESSMENT & PLAN:   # 83 year old female patient with history of stage I squamous cell lung cancer status post  radiation [finished November 2017] Is currently admitted the hospital for worsening shortness of breath cough chest wall pain.  # Acute respiratory failure-pneumonitis-Radiation versus infectious vs recurrent cancer. no significant  improvement noted patient currently on high flow nasal oxygen.  On high-dose steroids; antibiotics. Appreciate pulmonary input. Patient poor candidate for any aggressive interventions like- bronchoscopy. Long discussion the patient'daughter/grandson- regarding hospice/palliative care. They're in agreement- interested in quality of life. Palliative care has been consulted; awaiting evaluation this morning.  # Discussed with Dr. Tressia Miners.   Cammie Sickle, MD 11/13/2016 1:28 PM

## 2016-11-13 NOTE — Consult Note (Signed)
Consultation Note Date: 11/13/2016   Patient Name: Melissa Palmer  DOB: 12/15/1927  MRN: [id number]  Age / Sex: 83 y.o., female  PCP: Denita Lung, DO Referring Physician: Gladstone Lighter, MD  Reason for Consultation: Establishing goals of care  HPI/Patient Profile: 83 y.o. female  with past medical history of TIA, dementia, afib, pacemaker, stage 1 lung cancer, and mild pulmonary hypertension who was admitted on 10/17/2016 with left sided chest pain, pneumonia, acute kidney injury and UTI.  Initially she was stable but then had acute respiratory decompensation of uncertain etiology.  She has been evaluated by Pulmonary critical care as well as Oncology, and unfortunately the patient is unable to tolerate further diagnostics or treatment.  Palliative care has been recommended.  Clinical Assessment and Goals of Care: I met with the patient at bedside.  She is very lethargic but denies pain or hunger.  Later I met with her 2 dtrs and grandson.  I introduced Palliative Medicine is specialized medical care for people living with serious illness. It focuses on providing relief from the symptoms and stress of a serious illness. The family expressed that they know their mother is dying.  We discussed the process of weaning the high flow oxygen and instituting comfort measures.  We also talked about what to expect at end of life. We talked at length about the pace maker.  I explained that once she passes and her heart tissue dies the pace maker will not beat.  The family was concerned that the pacemaker may prolong the dying process.  They want to consider whether or not to turn it off.   We discussed hospice house.  One daughter is medically trained (EMT) and felt that her mother is too fragile to transfer to hospice house.    The family asked about organ donation and donating their mother's body for medical research.   The  family requested a time table  - and when I replied "hours to days" they were surprised.  They mentioned they have 2 family members who are enroute to see their mother to say good bye and they requested that we not wean the oxygen down until the family has visited.  After our meeting I called [facility].  The patient is not a candidate for organ donation, but she is a candidate for tissue or whole body donation.  I gave the family contact numbers and a web site to further investigate "whole body donation".  Primary Decision Maker:  NEXT OF KIN, daughters and grandson.    SUMMARY OF RECOMMENDATIONS     Do not escalate care.    Will d/c medications not related to comfort and institute meds related to comfort such as morphine, ativan, and robinul.    Family considering whether or not to turn off the pace maker.    Pause before weaning oxygen to allow other family members to arrive in the next 24 hours from out of state.  Code Status/Advance Care Planning:  DNR    Symptom Management:  Morphine for pain and dyspnea  Robinul for secretions  Ativan for anxiety or insomnia  Will continue BP and rate control meds for comfort (if she is able to swallow pills)  Additional Recommendations (Limitations, Scope, Preferences):  Full Comfort Care, Minimize Medications, Initiate Comfort Feeding, No Artificial Feeding, No IV Antibiotics, No IV Fluids and No Lab Draws  Palliative Prophylaxis:   Aspiration, Delirium Protocol, Frequent Pain Assessment, Oral Care and Palliative Wound Care  Psycho-social/Spiritual:   Desire for further Chaplaincy support: yes.  Chaplain support is greatly appreciated.  Prognosis:   Hours - Days  Discharge Planning: Anticipated Hospital Death vs Hospice Facility      Primary Diagnoses: Present on Admission: . Pneumonia   I have reviewed the medical record, interviewed the patient and family, and examined the patient. The  following aspects are pertinent.  Past Medical History:  Diagnosis Date  . A-fib ([place])   . Cancer ([place])    lung ca  . Cataract   . Dementia   . Hypertension   . Hypertension   . TIA (transient ischemic attack)    Social History   Social History  . Marital status: Married    Spouse name: N/A  . Number of children: N/A  . Years of education: N/A   Social History Main Topics  . Smoking status: Former Research scientist (life sciences)  . Smokeless tobacco: Never Used  . Alcohol use No  . Drug use: No  . Sexual activity: Not Asked   Other Topics Concern  . None   Social History Narrative  . None   History reviewed. No pertinent family history. Scheduled Meds: . ampicillin-sulbactam (UNASYN) IV  3 g Intravenous Q12H  . apixaban  2.5 mg Oral BID  . azithromycin  500 mg Oral Daily  . chlorhexidine  15 mL Mouth Rinse BID  . cloNIDine  0.1 mg Oral BID  . diltiazem  360 mg Oral Daily  . docusate sodium  100 mg Oral BID  . feeding supplement (ENSURE ENLIVE)  237 mL Oral BID BM  . fentaNYL  25 mcg Transdermal Q72H  . fluvoxaMINE  25 mg Oral Q1200  . ipratropium-albuterol  3 mL Nebulization Q6H  . lidocaine  1 patch Transdermal Q24H  . lisinopril  10 mg Oral BID  . loratadine  10 mg Oral Daily  . mouth rinse  15 mL Mouth Rinse q12n4p  . methylPREDNISolone (SOLU-MEDROL) injection  60 mg Intravenous Q8H  . pantoprazole  40 mg Oral Daily   Continuous Infusions: PRN Meds:.acetaminophen, ALPRAZolam, guaiFENesin, labetalol, morphine CONCENTRATE, ondansetron (ZOFRAN) IV, polyethylene glycol, senna-docusate Allergies  Allergen Reactions  . Asa [Aspirin] Anaphylaxis  . Flagyl [Metronidazole] Itching  . Hydrochlorothiazide Itching  . Sulfa Antibiotics Itching   Review of Systems patient lethargic but denies pain or hunger  Physical Exam  Well developed elderly female on high flow nasal canula CV rrr Resp decreased breath sounds no frank w/c/r, NAD Abdomen soft, nt, nd Ext minimal edema  Vital  Signs: BP (!) 173/65   Pulse 81   Temp 97.8 F (36.6 C) (Oral)   Resp (!) 23   Ht 5' 4"  (1.626 m)   Wt 61.8 kg (136 lb 3.2 oz)   SpO2 92%   BMI 23.38 kg/m  Pain Assessment: PAINAD POSS *See Group Information*: 1-Acceptable,Awake and alert Pain Score: Asleep   SpO2: SpO2: 92 % O2 Device:SpO2: 92 % O2 Flow Rate: .O2 Flow Rate (L/min): 40 L/min  IO: Intake/output summary:  Intake/Output Summary (Last 24 hours)  at 11/13/16 0914 Last data filed at 11/13/16 0300  Gross per 24 hour  Intake              580 ml  Output              200 ml  Net              380 ml    LBM: Last BM Date: 11/11/16 Baseline Weight: Weight: 61.7 kg (136 lb) Most recent weight: Weight: 61.8 kg (136 lb 3.2 oz)     Palliative Assessment/Data:   Flowsheet Rows     Most Recent Value  Intake Tab  Referral Department  Hospitalist  Unit at Time of Referral  Cardiac/Telemetry Unit  Palliative Care Primary Diagnosis  Other (Comment)  Date Notified  11/11/16  Palliative Care Type  New Palliative care  Reason for referral  Clarify Goals of Care  Date of Admission  10/21/2016  Date first seen by Palliative Care  11/13/16  # of days Palliative referral response time  2 Day(s)  # of days IP prior to Palliative referral  5  Clinical Assessment  Palliative Performance Scale Score  20%  Pain Max last 24 hours  5  Pain Min Last 24 hours  2  Dyspnea Max Last 24 Hours  7  Dyspnea Min Last 24 hours  3  Psychosocial & Spiritual Assessment  Palliative Care Outcomes  Patient/Family meeting held?  Yes  Who was at the meeting?  daughter and patient  Palliative Care Outcomes  Clarified goals of care      Time In: 11:00 Time Out: 12:30 Time Total: 90 min. Greater than 50%  of this time was spent counseling and coordinating care related to the above assessment and plan.  Signed by: Imogene Burn, PA-C Palliative Medicine Pager: [phone]  Please contact Palliative Medicine Team phone at [phone] for  questions and concerns.  For individual provider: See Shea Evans

## 2016-11-13 NOTE — Care Management Important Message (Signed)
Important Message  Patient Details  Name: Shaela Boer MRN: [id number] Date of Birth: [birth year]   Medicare Important Message Given:  Yes    Shelbie Ammons, RN 11/13/2016, 8:27 AM

## 2016-11-13 NOTE — Progress Notes (Signed)
PT Cancellation Note  Patient Details Name: Melissa Palmer MRN: [id number] DOB: [birth year]   Cancelled Treatment:    Reason Eval/Treat Not Completed: Patient declined, no reason specified. Treatment attempted, pt refuses stating she doesn't feel well. Daughter in the room and elaborates on pt's status. Daughter notes pt has not been able to rest and has a difficult time with her breathing. Daughter appreciates pt being left to rest at this time. Both pt and daughter agreeable to re attempt PT later today.    Larae Grooms, PTA 11/13/2016, 12:07 PM

## 2016-11-13 NOTE — Progress Notes (Signed)
[place] at [place] NAME: Melissa Palmer    MR#:  [id number]  DATE OF BIRTH:  August 30, 1927  SUBJECTIVE:  CHIEF COMPLAINT:   Chief Complaint  Patient presents with  . Muscle Pain   - more lethargic today, appears comfortable, remains on high flow nasal cannula - family at bedside  REVIEW OF SYSTEMS:  Review of Systems  Unable to perform ROS: Critical illness    DRUG ALLERGIES:   Allergies  Allergen Reactions  . Asa [Aspirin] Anaphylaxis  . Flagyl [Metronidazole] Itching  . Hydrochlorothiazide Itching  . Sulfa Antibiotics Itching    VITALS:  Blood pressure (!) 173/65, pulse 81, temperature 97.8 F (36.6 C), temperature source Oral, resp. rate (!) 23, height '5\' 4"'$  (1.626 m), weight 61.8 kg (136 lb 3.2 oz), SpO2 (!) 88 %.  PHYSICAL EXAMINATION:  Physical Exam  GENERAL:  83 y.o.-year-old patient lying in the bed with no acute distress. Resting better EYES: Pupils equal, round, reactive to light and accommodation. No scleral icterus. Extraocular muscles intact.  HEENT: Head atraumatic, normocephalic. Oropharynx and nasopharynx clear. On high flow nasal cannula NECK:  Supple, no jugular venous distention. No thyroid enlargement, no tenderness.  LUNGS: Normal breath sounds bilaterally, no wheezing. Bibasilar crackles noted.  No use of accessory muscles of respiration. decreased breath sounds at the bases CARDIOVASCULAR: S1, S2 normal. No  rubs, or gallops. 3/6 systolic murmur present ABDOMEN: Soft, nontender, nondistended. Bowel sounds present. No organomegaly or mass.  EXTREMITIES: No pedal edema, cyanosis, or clubbing.  NEUROLOGIC: Cranial nerves II through XII are intact. Muscle strength 5/5 in all extremities. Sensation intact. Gait not checked. Global weakness noted. PSYCHIATRIC: The patient is resting today, when awakened, is alert and oriented x 2-3.  SKIN: No obvious rash, lesion, or ulcer.    LABORATORY PANEL:    CBC  Recent Labs Lab 11/12/16 0441  WBC 7.5  HGB 10.4*  HCT 32.2*  PLT 447*   ------------------------------------------------------------------------------------------------------------------  Chemistries   Recent Labs Lab 11/13/16 0436  NA 143  K 4.1  CL 106  CO2 30  GLUCOSE 191*  BUN 59*  CREATININE 1.11*  CALCIUM 8.6*   ------------------------------------------------------------------------------------------------------------------  Cardiac Enzymes  Recent Labs Lab 11/08/16 0513  TROPONINI 0.03*   ------------------------------------------------------------------------------------------------------------------  RADIOLOGY:  No results found.  EKG:   Orders placed or performed during the hospital encounter of 11/07/2016  . ED EKG  . ED EKG  . EKG 12-Lead  . 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  #1 acute hypoxic respiratory failure- Not on home oxygen -Acute decompensation after physical therapy, on high flow nasal cannula.Currently flow rate of 40 L, FiO2 of 73% -Secondary to radiation pneumonitis and ? Pulmonary fibrosis. LLL pneumonia -Pulmonary consulted. Nebs as needed. On High-dose steroids. - possible PNA cannot be excluded- on Unasyn and azithromycin. Stop ABX today - once pain is improved, encourage incentive spirometry -Appreciate pulmonary consultation by Dr. Alva Garnet  #2 Sepsis- UTI and PNA - on Unasyn, the cultures are negative. Urine cultures growing Proteus mirabilis which is sensitive to most antibiotics- stop ABX today  #2 left-sided chest pain-secondary to possible radiation pneumonitis. CT of the chest on admission 2 days ago with  no  further pneumonia or pulmonary embolism noted. Repeat chest x-rays in the last 2 days without any acute changes. Not sure if there is recurrence of cancer with a mild left pleural effusion as patient is in so much pain. Continue pain medications -Troponins have remained negative. -Continue pain medications as needed. Lidoderm patch place.  -increased the dose of fentanyl patch.  #3 history of atrial fibrillation-rate controlled, on Cardizem orally. -On eliquis for anticoagulation -Trouble with pacemaker, cardiology consulted to interrogate pacemaker. Echocardiogram normal with EF 91%, diastolic dysfunction noted, mildly elevated PA pressures noted  #4 left lung cancer-squamous cell carcinoma of left lung base, status post radiation, finished in 2017 October. -Currently not on any chemotherapy actively. Oncology consulted -Has some left pleural effusion, possible recurrence of cancer cannot be ruled out  #5 hypertension-On clonidine and lisinopril and Cardizem  #6 DVT prophylaxis-patient already on eliquis    Overall poor prognosis discussed with daughter and grandson at bedside. Also confirmed with pulmonary and oncology. Palliative care has been consulted. Wean oxygen to nasal cannula and possible discharge to hospice home   All the records are reviewed and case discussed with Care Management/Social Workerr. Management plans discussed with the patient, family and they are in agreement.  CODE STATUS: DNR  TOTAL TIME TAKING CARE OF THIS PATIENT: 36 minutes.   POSSIBLE D/C TODAY OR TOMORROW, DEPENDING ON CLINICAL CONDITION.   Gladstone Lighter M.D on 11/13/2016 at 12:07 PM  Between 7am to 6pm - Pager - [phone]  After 6pm go to [email or web address] - password [email or web address] [facility]  Office  [phone]  CC: Primary care physician; Denita Lung, DO

## 2016-11-13 NOTE — Plan of Care (Signed)
Problem: Respiratory: Goal: Respiratory status will improve Outcome: Not Progressing Patient was made comfort care, remains on HFNC.  Comments: Patient is comfort care. Remains on HFNC. Patient has no complaints of pain or shortness of breath.

## 2016-11-13 NOTE — Progress Notes (Signed)
No charge note.   PMT meeting with dtr at 11:00 today.  Imogene Burn, [place] [facility] Pager: [phone]

## 2016-11-13 NOTE — Progress Notes (Signed)
Chaplain made a follow up visit with pt. Chaplain had silent prayer because pt was unconscious. Chaplain made contact with family member to let them know that chaplain support is available.    11/13/16 1849  Clinical Encounter Type  Visited With Patient;Patient and family together  Visit Type Initial  Referral From Nurse  Consult/Referral To Chaplain  Spiritual Encounters  Spiritual Needs Prayer

## 2016-11-13 NOTE — Progress Notes (Signed)
Speech Language Pathology Dysphagia Treatment Patient Details Name: Melissa Palmer MRN: [id number] DOB: May 18, 1928 Today's Date: 11/13/2016   SLP consulted with nursing, pt and pt family to assess safety and compliance with current diet. NSG and family agree that pt is tolerating diet well. SLP educated family that [name] will continue to follow and monitorr for progression or needs and to contact if needs arise.  Treatment declined due to pt being tired and family had recently administered food.   [name] 11/13/2016, 1:29 PM

## 2016-11-13 NOTE — Progress Notes (Signed)
[place] responded to an OR for "Comfort Care." [facility] visited and met with Pt at 12:11pm. Pt daughter was feeding her on something, CH informed Pt and family that he will come back later. [place] visited Pt again at 1:20. Pt was lying on the bed and Pt's family were at bedside. [place] encouraged family and informed them that [facility] was available if needed. Pt's daughter requested [place] to offer prayers for the Pt and family. [place] provided prayer support and ministry of presence.     11/13/16 1400  Clinical Encounter Type  Visited With Patient;Patient and family together  Visit Type Spiritual support;Social support;Other (Comment)  Referral From Nurse  Consult/Referral To Chaplain  Spiritual Encounters  Spiritual Needs Prayer;Emotional;Other (Comment)

## 2016-11-14 ENCOUNTER — Ambulatory Visit: Payer: Medicare HMO

## 2016-11-14 DIAGNOSIS — R0902 Hypoxemia: Secondary | ICD-10-CM

## 2016-11-14 DIAGNOSIS — Z515 Encounter for palliative care: Secondary | ICD-10-CM

## 2016-11-14 MED ORDER — SODIUM CHLORIDE 0.9 % IV SOLN: 2.0000 mg/h | INTRAVENOUS | Status: DC

## 2016-11-14 MED ORDER — MORPHINE 100MG IN NS 100ML (1MG/ML) PREMIX INFUSION
2.0000 mg/h | INTRAVENOUS | Status: DC
  Filled 2016-11-14: qty 100

## 2016-11-14 MED ORDER — SODIUM CHLORIDE 0.9 % IV SOLN
2.0000 mg/h | INTRAVENOUS | Status: DC
  Administered 2016-11-14: 16:00:00 2 mg/h via INTRAVENOUS
  Filled 2016-11-14: qty 10

## 2016-11-14 MED ORDER — MORPHINE BOLUS VIA INFUSION
2.0000 mg | INTRAVENOUS | Status: DC | PRN
  Administered 2016-11-14: 2 mg via INTRAVENOUS
  Administered 2016-11-15 (×2): 4 mg via INTRAVENOUS
  Administered 2016-11-15 (×2): 2 mg via INTRAVENOUS
  Filled 2016-11-14: qty 5

## 2016-11-14 NOTE — Progress Notes (Signed)
Family members mentioned that the patient may have had some kind of reaction to morphine products in the past but at this point they wanted [place] to continue with patients current meds as long as she is comfortable and to make no changes

## 2016-11-14 NOTE — Progress Notes (Signed)
[place] at [place] NAME: Melissa Palmer    MR#:  [id number]  DATE OF BIRTH:  1927-11-05  SUBJECTIVE:  - more lethargic today, appears comfortable, remains on high flow nasal cannula - family at bedside  Spoke with them. Pt now on comfort measures only  REVIEW OF SYSTEMS:  Review of Systems  Unable to perform ROS: Critical illness    DRUG ALLERGIES:   Allergies  Allergen Reactions  . Asa [Aspirin] Anaphylaxis  . Flagyl [Metronidazole] Itching  . Hydrochlorothiazide Itching  . Sulfa Antibiotics Itching    VITALS:  Blood pressure (!) 153/59, pulse 81, temperature 97.5 F (36.4 C), temperature source Oral, resp. rate 20, height '5\' 4"'$  (1.626 m), weight 61.8 kg (136 lb 3.2 oz), SpO2 (!) 89 %.  PHYSICAL EXAMINATION:  Physical Exam  GENERAL:  83 y.o.-year-old patient lying in the bed with no acute distress. Resting better EYES: Pupils equal, round, reactive to light and accommodation. No scleral icterus. Extraocular muscles intact.  HEENT: Head atraumatic, normocephalic. Oropharynx and nasopharynx clear. On high flow nasal cannula NECK:  Supple, no jugular venous distention. No thyroid enlargement, no tenderness.  LUNGS: Normal breath sounds bilaterally, no wheezing. Bibasilar crackles noted.  No use of accessory muscles of respiration. decreased breath sounds at the bases CARDIOVASCULAR: S1, S2 normal. No  rubs, or gallops. 3/6 systolic murmur present ABDOMEN: Soft, nontender, nondistended. Bowel sounds present. No organomegaly or mass.  EXTREMITIES: No pedal edema, cyanosis, or clubbing.  NEUROLOGIC: did not test due to pt being on comfort care. PSYCHIATRIC:  patient is resting today SKIN: No obvious rash, lesion, or ulcer.    LABORATORY PANEL:   CBC  Recent Labs Lab 11/12/16 0441  WBC 7.5  HGB 10.4*  HCT 32.2*  PLT 447*    ------------------------------------------------------------------------------------------------------------------  Chemistries   Recent Labs Lab 11/13/16 0436  NA 143  K 4.1  CL 106  CO2 30  GLUCOSE 191*  BUN 59*  CREATININE 1.11*  CALCIUM 8.6*   ------------------------------------------------------------------------------------------------------------------  Cardiac Enzymes  Recent Labs Lab 11/08/16 0513  TROPONINI 0.03*   ------------------------------------------------------------------------------------------------------------------  RADIOLOGY:  No results found.  EKG:   Orders placed or performed during the hospital encounter of 10/19/2016  . ED EKG  . ED EKG  . EKG 12-Lead  . 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  #1 acute hypoxic respiratory failure- Not on home oxygen -Acute decompensation after physical therapy, on high flow nasal cannula.Currently flow rate of 40 L, FiO2 of 73% -Secondary to radiation pneumonitis and ? Pulmonary fibrosis. LLL pneumonia -Pulmonary was consulted. Nebs as needed. Was On High-dose steroids. - possible PNA cannot be excluded- was on Unasyn and azithromycin. Stopped ABX since pt comfort care  #2 Sepsis- UTI and PNA - was on Unasyn, the cultures are negative. Urine cultures growing Proteus mirabilis which is sensitive to most antibiotics  #2 left-sided chest pain-secondary to possible radiation pneumonitis. CT of the chest on admission 2 days ago with no further pneumonia or pulmonary embolism noted. Repeat chest x-rays in the last 2 days without any acute changes. Not sure if there is recurrence of cancer with a mild left  pleural effusion as patient is  in so much pain. Continue pain medications -Troponins have remained negative. -Continue pain medications as needed. Lidoderm patch place.  -increased the dose of fentanyl patch.  #3 history of atrial fibrillation-rate controlled  #4 left lung cancer-squamous cell carcinoma of left lung base, status post radiation, finished in 2017 October. -Currently not on any chemotherapy actively. Oncology was consulted -Has some left pleural effusion, possible recurrence of cancer cannot be ruled out  #5 hypertension  #6 DVT prophylaxis comfort measures   Overall poor prognosis discussed with daughter and grandson at bedside. Also confirmed with pulmonary and oncology. Palliative care has been consult appreciated very much. Pt is COMFORT MEASURES ONLY Wean oxygen to nasal cannula and possible discharge to hospice home once family from out of town is able to come visit.   All the records are reviewed and case discussed with Care Management/Social Workerr. Management plans discussed with the family and they are in agreement.  CODE STATUS: DNR  TOTAL TIME TAKING CARE OF THIS PATIENT: 30 minutes.   POSSIBLE D/C TODAY OR TOMORROW, DEPENDING ON CLINICAL CONDITION.   Sofia Vanmeter M.D on 11/14/2016 at 7:07 AM  Between 7am to 6pm - Pager - [phone]  After 6pm go to [email or web address] - password [email or web address] [place] [facility]  Office  [phone]  CC: Primary care physician; Denita Lung, DO

## 2016-11-14 NOTE — Progress Notes (Signed)
Daily Progress Note   Patient Name: Melissa Palmer       Date: 11/14/2016 DOB: [birth year]  Age: 83 y.o. MRN#: [id number] Attending Physician: Fritzi Mandes, MD Primary Care Physician: Viona Gilmore Admit Date: 10/16/2016  Reason for Consultation/Follow-up: Establishing goals of care  Subjective: Patient without complaints.  Tells me she is not hungry this morning.  She is very lethargic.   Later in the day the rest of her family [place] from [place] of [place].  She was able to recognize and converse with all of them.  Now her family tells me she is tired and ready to commence with full comfort.   Patient Profile/HPI: 83 y.o. female  with past medical history of TIA, dementia, afib, pacemaker, stage 1 lung cancer, and mild pulmonary hypertension who was admitted on 10/21/2016 with left sided chest pain, pneumonia, acute kidney injury and UTI.  Initially she was stable but then had acute respiratory decompensation of uncertain etiology.  She has been evaluated by Pulmonary critical care as well as Oncology, and unfortunately the patient is unable to tolerate further diagnostics or treatment.  On 4/2 the family opted for no escalation of care with emphasis on comfort and on 4/3 after all of the extended family arrived - her HCPOA told me she was tired and it is time for full comfort.     Orders were placed to hang a morphine gtt.   Length of Stay: 8  Current Medications: Scheduled Meds:  . chlorhexidine  15 mL Mouth Rinse BID  . fentaNYL  25 mcg Transdermal Q72H  . lidocaine  1 patch Transdermal Q24H  . mouth rinse  15 mL Mouth Rinse q12n4p    Continuous Infusions: . morphine 2 mg/hr (11/14/16 1544)    PRN Meds: acetaminophen, antiseptic oral rinse, glycopyrrolate **OR** glycopyrrolate **OR**  glycopyrrolate, guaiFENesin, haloperidol **OR** haloperidol **OR** haloperidol lactate, ipratropium-albuterol, LORazepam **OR** LORazepam **OR** LORazepam, morphine, morphine CONCENTRATE, ondansetron (ZOFRAN) IV, polyvinyl alcohol  Physical Exam       Elderly female, appears lethargic but comfortable.  Actively dying. Currently on high flow nasal canula at 35 L/min.  Vital Signs: BP (!) 176/62 (BP Location: Left Arm)   Pulse 96   Temp 98.5 F (36.9 C) (Oral)   Resp 20   Ht '5\' 4"'$  (1.626 m)   Wt 61.8 kg (  136 lb 3.2 oz)   SpO2 (!) 87%   BMI 23.38 kg/m  SpO2: SpO2: (!) 87 % O2 Device: O2 Device: High Flow Nasal Cannula O2 Flow Rate: O2 Flow Rate (L/min): 35 L/min  Intake/output summary:  Intake/Output Summary (Last 24 hours) at 11/14/16 1549 Last data filed at 11/14/16 8546  Gross per 24 hour  Intake                0 ml  Output                0 ml  Net                0 ml   LBM: Last BM Date: 11/11/16 Baseline Weight: Weight: 61.7 kg (136 lb) Most recent weight: Weight: 61.8 kg (136 lb 3.2 oz)       Palliative Assessment/Data:    Flowsheet Rows     Most Recent Value  Intake Tab  Referral Department  Hospitalist  Unit at Time of Referral  Cardiac/Telemetry Unit  Palliative Care Primary Diagnosis  Other (Comment)  Date Notified  11/11/16  Palliative Care Type  New Palliative care  Reason for referral  Clarify Goals of Care  Date of Admission  11/09/2016  Date first seen by Palliative Care  11/13/16  # of days Palliative referral response time  2 Day(s)  # of days IP prior to Palliative referral  5  Clinical Assessment  Palliative Performance Scale Score  20%  Pain Max last 24 hours  5  Pain Min Last 24 hours  2  Dyspnea Max Last 24 Hours  7  Dyspnea Min Last 24 hours  3  Psychosocial & Spiritual Assessment  Palliative Care Outcomes  Patient/Family meeting held?  Yes  Who was at the meeting?  daughter and patient  Palliative Care Outcomes  Clarified goals of care        Patient Active Problem List   Diagnosis Date Noted  . Malignant neoplasm of left lung ([place])   . Palliative care encounter   . Goals of care, counseling/discussion   . Encounter for hospice care discussion   . Pneumonitis 10/23/2016  . Iron deficiency anemia due to chronic blood loss 09/20/2016  . Radiation pneumonitis ([place]) 09/19/2016  . Primary cancer of left lower lobe of lung ([place]) 04/13/2016  . Stroke (cerebrum) ([place]) 03/07/2016    Palliative Care Plan    Recommendations/Plan:  Full comfort measures.   Morphine gtt with prn boluses ordered.  Will plan to wean patient from high flow after morphine has had 6+ hours to infuse to steady state.  Goals of Care and Additional Recommendations:  Limitations on Scope of Treatment: Full Comfort Care  Code Status:  DNR  Prognosis:   Hours - Days   Discharge Planning:  Anticipated Hospital Death  Care plan was discussed with family at bedside, RN and Charge RN  Thank you for allowing the Palliative Medicine Team to assist in the care of this patient.  Total time spent:  35 min.     Greater than 50%  of this time was spent counseling and coordinating care related to the above assessment and plan.  Imogene Burn, PA-C Palliative Medicine  Please contact Palliative MedicineTeam phone at [phone] for questions and concerns between 7 am - 7 pm.   Please see AMION for individual provider pager numbers.

## 2016-11-15 DIAGNOSIS — Z515 Encounter for palliative care: Secondary | ICD-10-CM

## 2016-11-15 DIAGNOSIS — R112 Nausea with vomiting, unspecified: Secondary | ICD-10-CM

## 2016-11-15 DIAGNOSIS — R079 Chest pain, unspecified: Secondary | ICD-10-CM

## 2016-11-15 DIAGNOSIS — F419 Anxiety disorder, unspecified: Secondary | ICD-10-CM

## 2016-11-15 DIAGNOSIS — R0600 Dyspnea, unspecified: Secondary | ICD-10-CM

## 2016-11-16 ENCOUNTER — Inpatient Hospital Stay: Payer: Medicare HMO

## 2016-11-16 ENCOUNTER — Inpatient Hospital Stay: Payer: Medicare HMO | Admitting: Internal Medicine

## 2016-11-27 ENCOUNTER — Other Ambulatory Visit: Payer: Self-pay | Admitting: Nurse Practitioner

## 2016-12-12 NOTE — Progress Notes (Addendum)
Melissa Palmer    MR#:  [id number]  DATE OF BIRTH:  09/02/1927  SUBJECTIVE:  - more lethargic today, appears comfortable, remains on nasal cannula - family at bedside  Spoke with them. Pt now on comfort measures only and morphine gtt started y'day  REVIEW OF SYSTEMS:  Review of Systems  Unable to perform ROS: Critical illness    DRUG ALLERGIES:   Allergies  Allergen Reactions  . Asa [Aspirin] Anaphylaxis  . Flagyl [Metronidazole] Itching  . Hydrochlorothiazide Itching  . Sulfa Antibiotics Itching    VITALS:  Blood pressure (!) 155/50, pulse 83, temperature 99.5 F (37.5 C), temperature source Oral, resp. rate 20, height '5\' 4"'$  (1.626 m), weight 61.8 kg (136 lb 3.2 oz), SpO2 (!) 69 %.  PHYSICAL EXAMINATION:  Physical Exam  GENERAL:  83 y.o.-year-old patient lying in the bed with no acute distress. Resting better EYES: Pupils equal, round, reactive to light and accommodation. No scleral icterus. Extraocular muscles intact.  HEENT: Head atraumatic, normocephalic. Oropharynx and nasopharynx clear. Onnasal cannula NECK:  Supple, no jugular venous distention. No thyroid enlargement, no tenderness.  LUNGS: Normal breath sounds bilaterally, no wheezing. Bibasilar crackles noted.  No use of accessory muscles of respiration. decreased breath sounds at the bases CARDIOVASCULAR: S1, S2 normal. No  rubs, or gallops. 3/6 systolic murmur present ABDOMEN: Soft, nontender, nondistended. Bowel sounds present. No organomegaly or mass.  EXTREMITIES: No pedal edema, cyanosis, or clubbing.  NEUROLOGIC: did not test due to pt being on comfort care. PSYCHIATRIC:  patient is resting today SKIN: No obvious rash, lesion, or ulcer.    LABORATORY PANEL:   CBC  Recent Labs Lab 11/12/16 0441  WBC 7.5  HGB 10.4*  HCT 32.2*  PLT 447*    ------------------------------------------------------------------------------------------------------------------  Chemistries   Recent Labs Lab 11/13/16 0436  NA 143  K 4.1  CL 106  CO2 30  GLUCOSE 191*  BUN 59*  CREATININE 1.11*  CALCIUM 8.6*   ------------------------------------------------------------------------------------------------------------------  Cardiac Enzymes No results for input(s): TROPONINI in the last 168 hours. ------------------------------------------------------------------------------------------------------------------  RADIOLOGY:  No results found.  EKG:   Orders placed or performed during the hospital encounter of 11/08/2016  . ED EKG  . ED EKG  . EKG 12-Lead  . 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  #1 acute hypoxic respiratory failure- Not on home oxygen -Acute decompensation after physical therapy, on high flow nasal cannula.Currently flow rate of 40 L, FiO2 of 73% -Secondary to radiation pneumonitis and ? Pulmonary fibrosis. LLL pneumonia -Pulmonary was consulted. Nebs as needed. Was On High-dose steroids. - possible PNA cannot be excluded- was on Unasyn and azithromycin. Stopped ABX since pt comfort care  #2 Sepsis- UTI and PNA - was on Unasyn, the cultures are negative. Urine cultures growing Proteus mirabilis which is sensitive to most antibiotics  #2 left-sided chest pain-secondary to possible radiation pneumonitis. CT of the chest on admission 2 days ago with no further pneumonia or pulmonary embolism noted. Repeat chest x-rays in the last 2 days without any acute changes. Not sure if there is recurrence of cancer with a mild left  pleural effusion as patient is in  so much pain. Continue pain medications -Troponins have remained negative. -Continue pain medications as needed. Lidoderm patch place.  -increased the dose of fentanyl patch.  #3 history of atrial fibrillation-rate controlled  #4 left lung cancer-squamous cell carcinoma of left lung base, status post radiation, finished in 2017 October. -Currently not on any chemotherapy actively. Oncology was consulted -Has some left pleural effusion, possible recurrence of cancer cannot be ruled out  #5 hypertension  #6 DVT prophylaxis comfort measures   Overall poor prognosis discussed with daughter and grandson at bedside.  Palliative care has been consult appreciated very much. Pt is COMFORT MEASURES ONLY Now on nasal cannula oxygen  All the records are reviewed and case discussed with Care Management/Social Workerr. Management plans discussed with the family and they are in agreement.  CODE STATUS: DNR  TOTAL TIME TAKING CARE OF THIS PATIENT:20 minutes.     Emmilynn Marut M.D on 11-30-2016 at 7:16 AM  Between 7am to 6pm - Pager - [phone]  After 6pm go to [email or web address] - password [email or web address] [place] [facility]  Office  [phone]  CC: Primary care physician; Denita Lung, DO

## 2016-12-12 NOTE — Plan of Care (Signed)
Problem: Education: Goal: Knowledge of [facility] Education information/materials will improve Outcome: Progressing Weaned from HFNC to [facility], satting 69%.  Breathing unlabored.  Responds to pain, non-verbal during shift.  No bolus doses of morphine needed.  Pt's family at bedside, call bell within reach.  WCTM.

## 2016-12-12 NOTE — Discharge Summary (Signed)
DEATH SUMMARY   Patient Details  Name: Melissa Palmer MRN: [id number] DOB: [birth year]  Admission/Discharge Information   Admit Date:  2016-11-22  Date of Death: Date of Death: 12-01-16  Time of Death: Time of Death: 30  Length of Stay: 11/06/22  Referring Physician: Denita Lung, DO   Reason(s) for Hospitalization   worsening left-sided chest pain and generalized body aches and weakness. Diagnoses  Preliminary cause of death:   Acute HypoxicRespiratory failure secondary to radiation pneumonitis/pulmonary fibrosis Sepsis due to pneumonia and UTI  Secondary Diagnoses (including complications and co-morbidities):  Active Problems:   Pneumonitis   Malignant neoplasm of left lung [facility])   Palliative care encounter   Goals of care, counseling/discussion   Encounter for hospice care discussion   Hypoxia   Comfort measures only status   Dyspnea   Terminal care   Brief Hospital Course (including significant findings, care, treatment, and services provided and events leading to death)  Melissa Palmer is a 83 y.o. 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  Patient was admitted with acute on hypoxic respiratory failure which was suspected due to radiation pneumonitis versus pulmonary fibrosis with some underlying pneumonia. She was started on IV antibiotics of breathing treatments and oxygen. Patient's urine culture grew Proteus and was continued on antibiotics. Her overall prognosis was poor. She was declining. She was placed on high flow nasal cannula oxygen. Melissa Palmer discussions were made by palliative care pulmonary and oncology consultations were made. After discussion family decided patient be on comfort measures. Morphine drip was started.  Patient passed away peacefully on 12-02-2022 at 1340 hrs.     Pertinent Labs and Studies  Significant Diagnostic Studies Ct Head Wo Contrast  Result Date: Nov 22, 2016 CLINICAL DATA:  Body aches and generalized weakness for 3 months. EXAM: CT HEAD WITHOUT CONTRAST TECHNIQUE: Contiguous axial images were obtained from the base of the skull through the vertex without intravenous contrast. COMPARISON:  Head CT scan 03/08/2016. FINDINGS: Brain: The brain is atrophic with fairly extensive chronic microvascular ischemic change. Remote right frontal infarct is noted. No acute abnormality including infarction, hemorrhage, mass lesion, mass effect, midline shift or abnormal extra-axial fluid collection. No hydrocephalus or pneumocephalus. Vascular: Atherosclerosis noted. Skull: Intact. Sinuses/Orbits: No acute abnormality. Other: None. IMPRESSION: No acute abnormality. Atrophy, chronic microvascular ischemic change remote right frontal infarct. Atherosclerosis. Electronically Signed   By: Inge Rise M.D.   On: Nov 22, 2016 11:10   Ct Angio Chest Pe W And/or Wo Contrast  Result Date: 22-Nov-2016 CLINICAL DATA:  Cough and chest pain for 1 week. Finished radiation therapy in October for lung cancer. EXAM: CT ANGIOGRAPHY CHEST WITH CONTRAST TECHNIQUE: Multidetector CT imaging of the chest was performed using the standard protocol during bolus administration of intravenous contrast. Multiplanar CT image reconstructions and MIPs were obtained to evaluate the vascular anatomy. CONTRAST:  60 cc of Isovue 370 COMPARISON:  09/26/2016 FINDINGS: Cardiovascular: The quality of this exam for evaluation of pulmonary embolism is good. The only limitation is minimal motion inferiorly. No evidence of pulmonary embolism. Extensive colonic diverticulosis. Ulcerative plaque in the transverse aorta. Mild cardiomegaly, without pericardial effusion. Multivessel coronary artery atherosclerosis. Mediastinum/Nodes: A node within the  azygoesophageal recess measures 12 mm on image 45/series 4 versus maximally 7 mm on the prior exam.  No hilar adenopathy. Lungs/Pleura: Slight increase in small left pleural effusion. Trace right pleural fluid or thickening. Moderate centrilobular emphysema. 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 Basilar predominant subpleural reticulation with architectural distortion. Left lower lobe consolidation with only peripheral air bronchograms, increased. Underlying fiducials. Upper Abdomen: Normal imaged portions of the liver, spleen, stomach, pancreas, adrenal glands, kidneys. Musculoskeletal: Diminutive right fifth rib is likely developmental. Review of the MIP images confirms the above findings. IMPRESSION: 1.  No evidence of pulmonary embolism.  Minimal motion degradation. 2. Increase in left lower lobe consolidation with slight increase in small left pleural effusion. Increased consolidation could be due to evolving radiation change. Residual/recurrent disease cannot be excluded. 3. Developing adenopathy within the azygoesophageal recess station of the mediastinum. Suspicious for nodal metastasis. Reactive adenopathy could look similar. This could be re-evaluated with short term follow-up CT or further characterized with PET. 4.  Coronary artery atherosclerosis. Aortic atherosclerosis. 5. 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 Electronically Signed   By: Abigail Miyamoto M.D.   On: 10/16/2016 09:22   Dg Chest Port 1 View  Result Date: 11/10/2016 CLINICAL DATA:  Shortness of breath and chest pain EXAM: PORTABLE CHEST 1 VIEW COMPARISON:  November 08, 2016 chest radiograph and chest CT November 06, 2016 FINDINGS: There is persistent consolidation in the left lower lobe with left pleural effusion. There is underlying diffuse fibrotic type change. There may well be superimposed interstitial edema in this area as well. Heart is upper normal in size with pulmonary vascularity  within normal limits. Pacemaker leads are attached to the right atrium and right ventricle. There is atherosclerotic calcification in the aorta. There are surgical clips over the right breast. IMPRESSION: Persistent airspace consolidation left base with left pleural effusion. Suspect pneumonia in this area; underlying mass in this area cannot be excluded. Widespread pulmonary fibrosis remains. There may be a degree of superimposed interstitial edema. The overall appearance is stable compared to recent prior studies. Cardiac silhouette is unchanged.  There is aortic atherosclerosis. Electronically Signed   By: Lowella Grip III M.D.   On: 11/10/2016 08:12   Dg Chest Port 1 View  Result Date: 11/08/2016 CLINICAL DATA:  Pneumonia. EXAM: PORTABLE CHEST 1 VIEW COMPARISON:  11/07/2016 .  CT 11/02/2016. FINDINGS: Mediastinum is stable. Cardiac pacer in stable position. Heart size stable. Surgical clips noted over the lower chest. Persistent density is noted in the left lung base consistent with treatment changes and/or residual or recurrent tumor. Stable bilateral interstitial prominence. Pneumonitis cannot be excluded. Small bilateral pleural effusions. No pneumothorax. IMPRESSION: 1. Persistent persistent density in the left lung base without interim change. This could represent treatment changes and/or residual/recurrent tumor. Pneumonia cannot be excluded. Stable bilateral interstitial prominence. Pneumonitis cannot be excluded. 2. Cardiac pacer stable position. Stable cardiomegaly . No pulmonary venous congestion. Small bilateral pleural effusions. Electronically Signed   By: Marcello Moores  Register   On: 11/08/2016 06:37   Dg Chest Port 1 View  Result Date: 11/07/2016 CLINICAL DATA:  Hypoxia. Left lower lobe lung cancer treated with radiation therapy in October. EXAM: PORTABLE CHEST 1 VIEW COMPARISON:  09/19/2016 chest radiograph. FINDINGS: Stable configuration of 2 lead right subclavian pacemaker. Fiducial  markers overlie the left lower lung. Stable cardiomediastinal silhouette with top-normal heart size and aortic atherosclerosis. No pneumothorax. Stable small left pleural effusion. No right pleural effusion. No pulmonary edema. Patchy consolidation at the left lung base appears slightly worsened since the CT study from 1 day prior. Extensive reticular interstitial opacities  at the lung bases appear unchanged. IMPRESSION: 1. Stable small left pleural effusion. 2. Slight worsening of patchy left lung base consolidation, which could represent progression of radiation pneumonitis and/or pneumonia superimposed on tumor. 3. Extensive underlying bibasilar reticular interstitial opacities, suspect underlying interstitial lung disease. 4. Aortic atherosclerosis. Electronically Signed   By: Ilona Sorrel M.D.   On: 11/07/2016 18:22    Microbiology Recent Results (from the past 240 hour(s))  Urine culture     Status: Abnormal   Collection Time: 10/29/2016 11:55 AM  Result Value Ref Range Status   Specimen Description URINE, RANDOM  Final   Special Requests ADDED ON 1957 10/26/2016  Final   Culture >=100,000 COLONIES/mL PROTEUS MIRABILIS (A)  Final   Report Status 11/08/2016 FINAL  Final   Organism ID, Bacteria PROTEUS MIRABILIS (A)  Final      Susceptibility   Proteus mirabilis - MIC*    AMPICILLIN <=2 SENSITIVE Sensitive     CEFAZOLIN <=4 SENSITIVE Sensitive     CEFTRIAXONE <=1 SENSITIVE Sensitive     CIPROFLOXACIN <=0.25 SENSITIVE Sensitive     GENTAMICIN <=1 SENSITIVE Sensitive     IMIPENEM 4 SENSITIVE Sensitive     NITROFURANTOIN RESISTANT Resistant     TRIMETH/SULFA <=20 SENSITIVE Sensitive     AMPICILLIN/SULBACTAM <=2 SENSITIVE Sensitive     PIP/TAZO <=4 SENSITIVE Sensitive     * >=100,000 COLONIES/mL PROTEUS MIRABILIS  Culture, blood (routine x 2) Call MD if unable to obtain prior to antibiotics being given     Status: None   Collection Time: 10/27/2016 12:40 PM  Result Value Ref Range Status    Specimen Description BLOOD LEFT HAND  Final   Special Requests BOTTLES DRAWN AEROBIC AND ANAEROBIC  [place]  Final   Culture NO GROWTH 5 DAYS  Final   Report Status 11/11/2016 FINAL  Final  Culture, blood (routine x 2) Call MD if unable to obtain prior to antibiotics being given     Status: None   Collection Time: 11/03/2016 12:49 PM  Result Value Ref Range Status   Specimen Description BLOOD RIGHT HAND  Final   Special Requests   Final    BOTTLES DRAWN AEROBIC AND ANAEROBIC Blood Culture results may not be optimal due to an excessive volume of blood received in culture bottles   Culture NO GROWTH 5 DAYS  Final   Report Status 11/11/2016 FINAL  Final  MRSA PCR Screening     Status: None   Collection Time: 11/07/16  1:25 PM  Result Value Ref Range Status   MRSA by PCR NEGATIVE NEGATIVE Final    Comment:        The GeneXpert MRSA Assay (FDA approved for NASAL specimens only), is one component of a comprehensive MRSA colonization surveillance program. It is not intended to diagnose MRSA infection nor to guide or monitor treatment for MRSA infections.     Lab Basic Metabolic Panel:  Recent Labs Lab 11/10/16 0451 11/11/16 0433 11/12/16 0441 11/13/16 0436  NA 139 140 141 143  K 4.8 4.6 4.0 4.1  CL 107 106 104 106  CO2 '25 27 29 30  '$ GLUCOSE 159* 151* 157* 191*  BUN 33* 38* 52* 59*  CREATININE 1.11* 1.16* 1.40* 1.11*  CALCIUM 9.1 8.9 8.5* 8.6*   Liver Function Tests: No results for input(s): AST, ALT, ALKPHOS, BILITOT, PROT, ALBUMIN in the last 168 hours. No results for input(s): LIPASE, AMYLASE in the last 168 hours. No results for input(s): AMMONIA in the last 168  hours. CBC:  Recent Labs Lab 11/12/16 0441  WBC 7.5  HGB 10.4*  HCT 32.2*  MCV 74.7*  PLT 447*   Cardiac Enzymes: No results for input(s): CKTOTAL, CKMB, CKMBINDEX, TROPONINI in the last 168 hours. Sepsis Labs:  Recent Labs Lab 11/12/16 0441  WBC 7.5    Procedures/Operations      Melissa Palmer 11/29/2016, 2:24 PM

## 2016-12-12 NOTE — Progress Notes (Signed)
Daily Progress Note   Patient Name: Melissa Palmer       Date: 2016/12/14 DOB: November 08, 1927  Age: 83 y.o. MRN#: [id number] Attending Physician: Fritzi Mandes, MD Primary Care Physician: Denita Lung, DO Admit Date: 11/07/2016  Reason for Consultation/Follow-up: Establishing goals of care  Subjective: Patient sleeping comfortably.  Family at bedside.  Family reports patient had a vomiting episode last evening after morphine was hung.  Otherwise peaceful night.  I sat with the family for approximately 40 min.  We discussed death, family, and the patient's comfort.  Patient Profile/HPI: 83 y.o. female  with past medical history of TIA, dementia, afib, pacemaker, stage 1 lung cancer, and mild pulmonary hypertension who was admitted on 10/30/2016 with left sided chest pain, pneumonia, acute kidney injury and UTI.  Initially she was stable but then had acute respiratory decompensation of uncertain etiology.  She has been evaluated by Pulmonary critical care as well as Oncology, and unfortunately the patient is unable to tolerate further diagnostics or treatment.  On 4/2 the family opted for no escalation of care with emphasis on comfort and on 4/3 after all of the extended family arrived - her HCPOA told me she was tired and it is time for full comfort.      Length of Stay: 9  Current Medications: Scheduled Meds:  . chlorhexidine  15 mL Mouth Rinse BID  . fentaNYL  25 mcg Transdermal Q72H  . lidocaine  1 patch Transdermal Q24H  . mouth rinse  15 mL Mouth Rinse q12n4p    Continuous Infusions: . morphine 4 mg/hr (Dec 14, 2016 0810)    PRN Meds: acetaminophen, antiseptic oral rinse, glycopyrrolate **OR** glycopyrrolate **OR** glycopyrrolate, guaiFENesin, haloperidol **OR** haloperidol **OR** haloperidol  lactate, ipratropium-albuterol, LORazepam **OR** LORazepam **OR** LORazepam, morphine, morphine CONCENTRATE, ondansetron (ZOFRAN) IV, polyvinyl alcohol  Physical Exam       Wd female, non-responsive, actively dying.  On 2L n/c CV rrr, resp decreased NAD  Vital Signs: BP (!) 155/50 (BP Location: Left Arm)   Pulse 83   Temp 99.5 F (37.5 C) (Oral)   Resp 20   Ht '5\' 4"'$  (1.626 m)   Wt 61.8 kg (136 lb 3.2 oz)   SpO2 (!) 69%   BMI 23.38 kg/m  SpO2: SpO2: (!) 69 % O2 Device: O2 Device: Nasal Cannula O2 Flow Rate: O2 Flow  Rate (L/min): 2 L/min  Intake/output summary:   Intake/Output Summary (Last 24 hours) at Dec 02, 2016 0846 Last data filed at 02-Dec-2016 0645  Gross per 24 hour  Intake            32.53 ml  Output                0 ml  Net            32.53 ml   LBM: Last BM Date: 11/11/16 Baseline Weight: Weight: 61.7 kg (136 lb) Most recent weight: Weight: 61.8 kg (136 lb 3.2 oz)       Palliative Assessment/Data:    Flowsheet Rows     Most Recent Value  Intake Tab  Referral Department  Hospitalist  Unit at Time of Referral  Cardiac/Telemetry Unit  Palliative Care Primary Diagnosis  Other (Comment)  Date Notified  11/11/16  Palliative Care Type  New Palliative care  Reason for referral  Clarify Goals of Care  Date of Admission  10/17/2016  Date first seen by Palliative Care  11/13/16  # of days Palliative referral response time  2 Day(s)  # of days IP prior to Palliative referral  5  Clinical Assessment  Palliative Performance Scale Score  10%  Pain Max last 24 hours  5  Pain Min Last 24 hours  2  Dyspnea Max Last 24 Hours  7  Dyspnea Min Last 24 hours  3  Psychosocial & Spiritual Assessment  Palliative Care Outcomes  Patient/Family meeting held?  Yes  Who was at the meeting?  daughter and patient  Palliative Care Outcomes  Clarified goals of care      Patient Active Problem List   Diagnosis Date Noted  . Hypoxia   . Comfort measures only status   . Malignant  neoplasm of left lung ([place])   . Palliative care encounter   . Goals of care, counseling/discussion   . Encounter for hospice care discussion   . Pneumonitis 11/01/2016  . Iron deficiency anemia due to chronic blood loss 09/20/2016  . Radiation pneumonitis ([place]) 09/19/2016  . Primary cancer of left lower lobe of lung ([place]) 04/13/2016  . Stroke (cerebrum) ([place]) 03/07/2016    Palliative Care Plan    Recommendations/Plan:  Full comfort measures.   Morphine gtt with prn boluses ordered.  Will plan to d/c oxygen later this afternoon.  Goals of Care and Additional Recommendations:  Limitations on Scope of Treatment: Full Comfort Care  Code Status:  DNR  Prognosis:   Hours - Days   Discharge Planning:  Anticipated Hospital Death  Care plan was discussed with family at bedside, RN and Charge RN  Thank you for allowing the Palliative Medicine Team to assist in the care of this patient.  Total time spent:  40 min.     Greater than 50%  of this time was spent counseling and coordinating care related to the above assessment and plan.  Imogene Burn, PA-C Palliative Medicine  Please contact Palliative MedicineTeam phone at [phone] for questions and concerns between 7 am - 7 pm.   Please see AMION for individual provider pager numbers.

## 2016-12-12 NOTE — Clinical Social Work Note (Signed)
CSW is continuing to follow for discharge planning needs. Pt is comfort care, and per Palliative Care NP a hospital death is anticipated.   Darden Dates, MSW, LCSW  Clinical Social Worker  [phone]

## 2016-12-12 NOTE — Progress Notes (Signed)
Writer called [employer] to confirm that there is nothing to be done with the pacemaker. Representative from [employer] looked up patients specific pacemaker and told me that it is no longer working effectively to prolong life and there is no way to turn it off. Strip in chart with pacemaker spikes. Ammie Dalton, RN

## 2016-12-12 NOTE — Progress Notes (Signed)
Speech Therapy note: reviewed chart notes; pt is now on comfort care. ST services will sign off w/ NSG to reconsult if any need for further education or assessment.    Orinda Kenner, [place], CCC-SLP

## 2016-12-12 NOTE — Progress Notes (Signed)
RN called to room by family member, this RN and Butch Penny, RN pronounced the patient's death at 13:40 2016-12-14. MD, AC, and COPA notified. She is not a candidate for any organ or tissue donation. Ammie Dalton, RN

## 2016-12-12 NOTE — Progress Notes (Signed)
[facility] NOTE  Patient Care Team: Denita Lung, DO as PCP - General (Internal Medicine)  CHIEF COMPLAINTS/PURPOSE OF CONSULTATION: Lung cancer; worsening respiratory distress  CC: Events over the weekend noted. Patient noted to have declining in the clinical status. Patient has been started on morphine drip given her anxiety/shortness of breath and chest pain. She episode of nausea with vomiting last night.. She has been resting well otherwise. Noted to have become "gurgly" as per family.   Review of system- cannot be obtained.   MEDICAL HISTORY:  Past Medical History:  Diagnosis Date  . A-fib ([place])   . Cancer ([place])    lung ca  . Cataract   . Dementia   . Hypertension   . Hypertension   . TIA (transient ischemic attack)     SURGICAL HISTORY: Past Surgical History:  Procedure Laterality Date  . BREAST SURGERY    . COLONOSCOPY    . HYSTEROTOMY    . UPPER GI ENDOSCOPY      SOCIAL HISTORY: Social History   Social History  . Marital status: Married    Spouse name: N/A  . Number of children: N/A  . Years of education: N/A   Occupational History  . Not on file.   Social History Main Topics  . Smoking status: Former Research scientist (life sciences)  . Smokeless tobacco: Never Used  . Alcohol use No  . Drug use: No  . Sexual activity: Not on file   Other Topics Concern  . Not on file   Social History Narrative  . No narrative on file    FAMILY HISTORY: History reviewed. No pertinent family history.  ALLERGIES:  is allergic to asa [aspirin]; flagyl [metronidazole]; hydrochlorothiazide; and sulfa antibiotics.  MEDICATIONS:  Current Facility-Administered Medications  Medication Dose Route Frequency Provider Last Rate Last Dose  . acetaminophen (TYLENOL) tablet 650 mg  650 mg Oral Q6H PRN Demetrios Loll, MD   650 mg at 11/07/16 1751  . antiseptic oral rinse (BIOTENE) solution 15 mL  15 mL Topical PRN Melton Alar, PA-C      . chlorhexidine (PERIDEX) 0.12 %  solution 15 mL  15 mL Mouth Rinse BID Gladstone Lighter, MD   15 mL at 12-05-2016 1000  . fentaNYL (DURAGESIC - dosed mcg/hr) patch 25 mcg  25 mcg Transdermal Q72H Gladstone Lighter, MD   25 mcg at 11/13/16 0955  . glycopyrrolate (ROBINUL) tablet 1 mg  1 mg Oral Q4H PRN Melton Alar, PA-C       Or  . glycopyrrolate (ROBINUL) injection 0.2 mg  0.2 mg Subcutaneous Q4H PRN Melton Alar, PA-C       Or  . glycopyrrolate (ROBINUL) injection 0.2 mg  0.2 mg Intravenous Q4H PRN Melton Alar, PA-C   0.2 mg at 12-05-16 0834  . guaiFENesin (ROBITUSSIN) 100 MG/5ML solution 100 mg  5 mL Oral Q4H PRN Demetrios Loll, MD      . haloperidol (HALDOL) tablet 0.5 mg  0.5 mg Oral Q4H PRN Melton Alar, PA-C       Or  . haloperidol (HALDOL) 2 MG/ML solution 0.5 mg  0.5 mg Sublingual Q4H PRN Melton Alar, PA-C       Or  . haloperidol lactate (HALDOL) injection 0.5 mg  0.5 mg Intravenous Q4H PRN Melton Alar, PA-C      . ipratropium-albuterol (DUONEB) 0.5-2.5 (3) MG/3ML nebulizer solution 3 mL  3 mL Nebulization Q2H PRN Melton Alar, PA-C      .  lidocaine (LIDODERM) 5 % 1 patch  1 patch Transdermal Q24H Gladstone Lighter, MD   1 patch at 11/14/16 1010  . LORazepam (ATIVAN) tablet 0.5 mg  0.5 mg Oral Q4H PRN Melton Alar, PA-C   0.5 mg at 11/13/16 2054   Or  . LORazepam (ATIVAN) 2 MG/ML concentrated solution 0.5 mg  0.5 mg Sublingual Q4H PRN Melton Alar, PA-C       Or  . LORazepam (ATIVAN) injection 0.5 mg  0.5 mg Intravenous Q4H PRN Melton Alar, PA-C      . MEDLINE mouth rinse  15 mL Mouth Rinse q12n4p Gladstone Lighter, MD   15 mL at 11/13/16 1626  . morphine 250 mg in sodium chloride 0.9 % 250 mL (1 mg/mL) infusion  2-5 mg/hr Intravenous Continuous Fritzi Mandes, MD   Stopped at 25-Nov-2016 1340  . morphine bolus via infusion 2-5 mg  2-5 mg Intravenous PRN Melton Alar, PA-C   4 mg at 11/25/2016 1251  . morphine CONCENTRATE 10 MG/0.5ML oral solution 10 mg  10 mg Oral Q2H PRN Gladstone Lighter, MD    10 mg at 11/14/16 1516  . ondansetron (ZOFRAN) injection 4 mg  4 mg Intravenous Q6H PRN Demetrios Loll, MD   4 mg at 11/14/16 1854  . polyvinyl alcohol (LIQUIFILM TEARS) 1.4 % ophthalmic solution 1 drop  1 drop Both Eyes QID PRN Melton Alar, PA-C          .  PHYSICAL EXAMINATION:  Vitals:   11/14/16 0920 11-25-2016 0351  BP: (!) 176/62 (!) 155/50  Pulse: 96 83  Resp: 20 20  Temp: 98.5 F (36.9 C) 99.5 F (37.5 C)   Filed Weights   10/26/2016 0659 10/21/2016 1311  Weight: 136 lb (61.7 kg) 136 lb 3.2 oz (61.8 kg)    GENERAL: Frail-appearing Caucasian female patient, drowsy no distress and comfortable. She is accompanied by family; 2 L of oxygen. EYES: no pallor or icterus OROPHARYNX: no thrush or ulceration. NECK: supple, no masses felt LYMPH:  no palpable lymphadenopathy in the cervical, axillary or inguinal regions LUNGS: decreased breath sounds to auscultation and no more than right. Crackles present. HEART/CVS: regular rate & rhythm and no murmurs; No lower extremity edema ABDOMEN: abdomen soft, non-tender and normal bowel sounds Musculoskeletal:no cyanosis of digits and no clubbing  PSYCH: sleepy/drowsy NEURO: no focal motor/sensory deficits SKIN:  no rashes or significant lesions  LABORATORY DATA:  I have reviewed the data as listed Lab Results  Component Value Date   WBC 7.5 11/12/2016   HGB 10.4 (L) 11/12/2016   HCT 32.2 (L) 11/12/2016   MCV 74.7 (L) 11/12/2016   PLT 447 (H) 11/12/2016    Recent Labs  04/25/16 1539 09/19/16 0934 11/04/2016 0732  11/11/16 0433 11/12/16 0441 11/13/16 0436  NA 135 135 136  < > 140 141 143  K 4.0 3.7 3.7  < > 4.6 4.0 4.1  CL 104 99* 103  < > 106 104 106  CO2 '24 26 26  '$ < > '27 29 30  '$ GLUCOSE 101* 117* 110*  < > 151* 157* 191*  BUN '19 15 16  '$ < > 38* 52* 59*  CREATININE 1.30* 1.17* 1.36*  < > 1.16* 1.40* 1.11*  CALCIUM 9.1 9.3 8.8*  < > 8.9 8.5* 8.6*  GFRNONAA 36* 41* 34*  < > 41* 33* 44*  GFRAA 42* 47* 40*  < > 48* 38* 51*   PROT 7.6 8.4* 5.9*  --   --   --   --  ALBUMIN 3.9 3.7 2.7*  --   --   --   --   AST '18 18 19  '$ --   --   --   --   ALT 9* 9* 26  --   --   --   --   ALKPHOS 66 78 42  --   --   --   --   BILITOT 0.3 0.3 0.6  --   --   --   --   < > = values in this interval not displayed.  RADIOGRAPHIC STUDIES: I have personally reviewed the radiological images as listed and agreed with the findings in the report. Ct Head Wo Contrast  Result Date: 11/01/2016 CLINICAL DATA:  Body aches and generalized weakness for 3 months. EXAM: CT HEAD WITHOUT CONTRAST TECHNIQUE: Contiguous axial images were obtained from the base of the skull through the vertex without intravenous contrast. COMPARISON:  Head CT scan 03/08/2016. FINDINGS: Brain: The brain is atrophic with fairly extensive chronic microvascular ischemic change. Remote right frontal infarct is noted. No acute abnormality including infarction, hemorrhage, mass lesion, mass effect, midline shift or abnormal extra-axial fluid collection. No hydrocephalus or pneumocephalus. Vascular: Atherosclerosis noted. Skull: Intact. Sinuses/Orbits: No acute abnormality. Other: None. IMPRESSION: No acute abnormality. Atrophy, chronic microvascular ischemic change remote right frontal infarct. Atherosclerosis. Electronically Signed   By: Inge Rise M.D.   On: 11/08/2016 11:10   Ct Angio Chest Pe W And/or Wo Contrast  Result Date: 10/29/2016 CLINICAL DATA:  Cough and chest pain for 1 week. Finished radiation therapy in October for lung cancer. EXAM: CT ANGIOGRAPHY CHEST WITH CONTRAST TECHNIQUE: Multidetector CT imaging of the chest was performed using the standard protocol during bolus administration of intravenous contrast. Multiplanar CT image reconstructions and MIPs were obtained to evaluate the vascular anatomy. CONTRAST:  60 cc of Isovue 370 COMPARISON:  09/26/2016 FINDINGS: Cardiovascular: The quality of this exam for evaluation of pulmonary embolism is good. The only  limitation is minimal motion inferiorly. No evidence of pulmonary embolism. Extensive colonic diverticulosis. Ulcerative plaque in the transverse aorta. Mild cardiomegaly, without pericardial effusion. Multivessel coronary artery atherosclerosis. Mediastinum/Nodes: A node within the azygoesophageal recess measures 12 mm on image 45/series 4 versus maximally 7 mm on the prior exam. No hilar adenopathy. Lungs/Pleura: Slight increase in small left pleural effusion. Trace right pleural fluid or thickening. Moderate centrilobular emphysema. 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 Basilar predominant subpleural reticulation with architectural distortion. Left lower lobe consolidation with only peripheral air bronchograms, increased. Underlying fiducials. Upper Abdomen: Normal imaged portions of the liver, spleen, stomach, pancreas, adrenal glands, kidneys. Musculoskeletal: Diminutive right fifth rib is likely developmental. Review of the MIP images confirms the above findings. IMPRESSION: 1.  No evidence of pulmonary embolism.  Minimal motion degradation. 2. Increase in left lower lobe consolidation with slight increase in small left pleural effusion. Increased consolidation could be due to evolving radiation change. Residual/recurrent disease cannot be excluded. 3. Developing adenopathy within the azygoesophageal recess station of the mediastinum. Suspicious for nodal metastasis. Reactive adenopathy could look similar. This could be re-evaluated with short term follow-up CT or further characterized with PET. 4.  Coronary artery atherosclerosis. Aortic atherosclerosis. 5. 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 Electronically Signed   By: Abigail Miyamoto M.D.   On: 10/23/2016 09:22   Dg Chest Port 1 View  Result Date: 11/10/2016 CLINICAL DATA:  Shortness of breath and chest pain EXAM: PORTABLE CHEST 1 VIEW COMPARISON:  November 08, 2016 chest  radiograph and chest CT November 06, 2016 FINDINGS: There is persistent consolidation in the left lower lobe with left pleural effusion. There is underlying diffuse fibrotic type change. There may well be superimposed interstitial edema in this area as well. Heart is upper normal in size with pulmonary vascularity within normal limits. Pacemaker leads are attached to the right atrium and right ventricle. There is atherosclerotic calcification in the aorta. There are surgical clips over the right breast. IMPRESSION: Persistent airspace consolidation left base with left pleural effusion. Suspect pneumonia in this area; underlying mass in this area cannot be excluded. Widespread pulmonary fibrosis remains. There may be a degree of superimposed interstitial edema. The overall appearance is stable compared to recent prior studies. Cardiac silhouette is unchanged.  There is aortic atherosclerosis. Electronically Signed   By: Lowella Grip III M.D.   On: 11/10/2016 08:12   Dg Chest Port 1 View  Result Date: 11/08/2016 CLINICAL DATA:  Pneumonia. EXAM: PORTABLE CHEST 1 VIEW COMPARISON:  11/07/2016 .  CT 11/10/2016. FINDINGS: Mediastinum is stable. Cardiac pacer in stable position. Heart size stable. Surgical clips noted over the lower chest. Persistent density is noted in the left lung base consistent with treatment changes and/or residual or recurrent tumor. Stable bilateral interstitial prominence. Pneumonitis cannot be excluded. Small bilateral pleural effusions. No pneumothorax. IMPRESSION: 1. Persistent persistent density in the left lung base without interim change. This could represent treatment changes and/or residual/recurrent tumor. Pneumonia cannot be excluded. Stable bilateral interstitial prominence. Pneumonitis cannot be excluded. 2. Cardiac pacer stable position. Stable cardiomegaly . No pulmonary venous congestion. Small bilateral pleural effusions. Electronically Signed   By: Marcello Moores  Register   On: 11/08/2016 06:37   Dg Chest Port  1 View  Result Date: 11/07/2016 CLINICAL DATA:  Hypoxia. Left lower lobe lung cancer treated with radiation therapy in October. EXAM: PORTABLE CHEST 1 VIEW COMPARISON:  09/19/2016 chest radiograph. FINDINGS: Stable configuration of 2 lead right subclavian pacemaker. Fiducial markers overlie the left lower lung. Stable cardiomediastinal silhouette with top-normal heart size and aortic atherosclerosis. No pneumothorax. Stable small left pleural effusion. No right pleural effusion. No pulmonary edema. Patchy consolidation at the left lung base appears slightly worsened since the CT study from 1 day prior. Extensive reticular interstitial opacities at the lung bases appear unchanged. IMPRESSION: 1. Stable small left pleural effusion. 2. Slight worsening of patchy left lung base consolidation, which could represent progression of radiation pneumonitis and/or pneumonia superimposed on tumor. 3. Extensive underlying bibasilar reticular interstitial opacities, suspect underlying interstitial lung disease. 4. Aortic atherosclerosis. Electronically Signed   By: Ilona Sorrel M.D.   On: 11/07/2016 18:22    ASSESSMENT & PLAN:   # 83 year old female patient with history of stage I squamous cell lung cancer status post radiation [finished November 2017] currently in the hospital for respiratory failure.  # Acute respiratory failure-pneumonitis-Radiation versus infectious vs recurrent cancer. Given the lack of improvement on high-dose steroids antibiotics- and ongoing poorly controlled symptoms- agree with hospice. Patient unstable to be discharged to hospice home. Recommend symptom control in the hospital. Agree With the addition of scopolamine patch. Discussed with palliative care.    # Discussed with Dr.Patel. Also discussed at length with the patient's daughter/grandson by the bedside.   Cammie Sickle, MD 11/29/2016 5:21 PM

## 2016-12-12 DEATH — deceased

## 2017-04-30 IMAGING — DX DG CHEST 1V PORT
1 series · 1 of 1 positions shown · non-contrast
Comparison: 11/07/2016 .  CT 11/06/2016.

CLINICAL DATA: Pneumonia.

EXAM:
PORTABLE CHEST 1 VIEW

[chest ap]
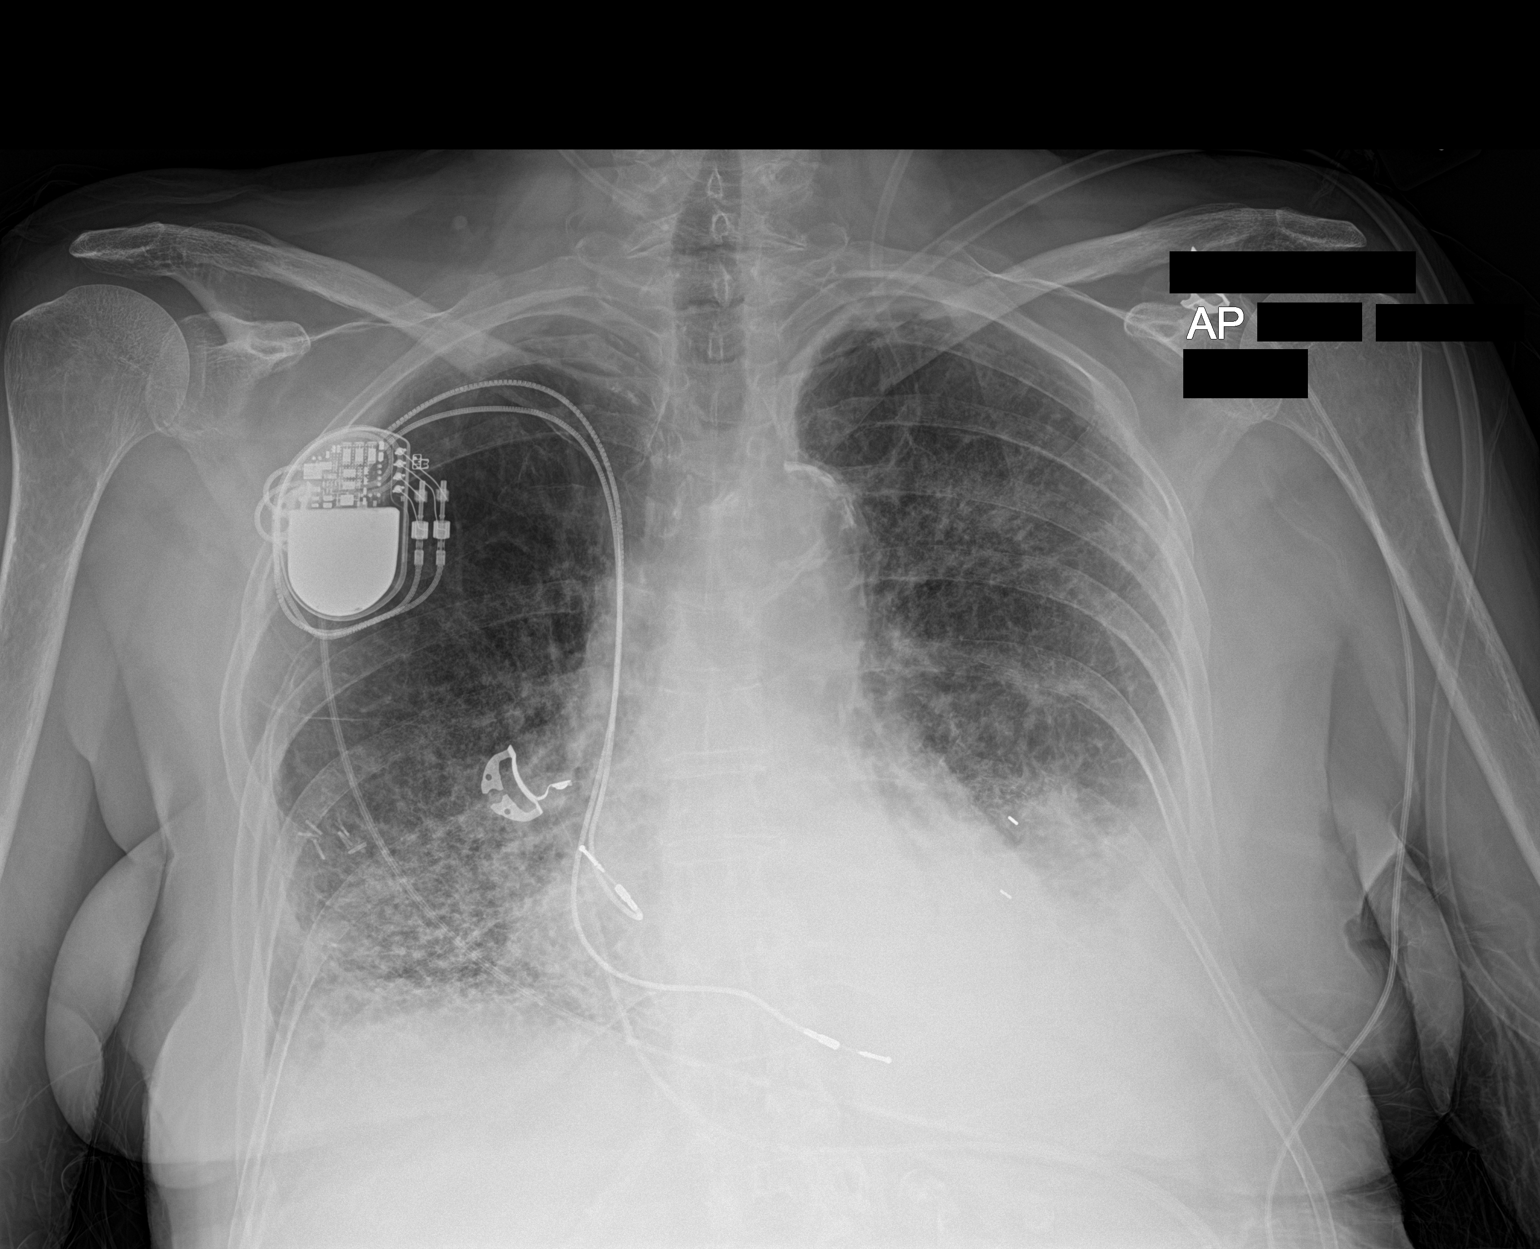

[1 of 1 positions shown; findings below may reference images not displayed]

FINDINGS: Mediastinum is stable. Cardiac pacer in stable position. Heart size
stable. Surgical clips noted over the lower chest. Persistent
density is noted in the left lung base consistent with treatment
changes and/or residual or recurrent tumor. Stable bilateral
interstitial prominence. Pneumonitis cannot be excluded. Small
bilateral pleural effusions. No pneumothorax.
IMPRESSION: 1. Persistent persistent density in the left lung base without
interim change. This could represent treatment changes and/or
residual/recurrent tumor. Pneumonia cannot be excluded. Stable
bilateral interstitial prominence. Pneumonitis cannot be excluded.

2. Cardiac pacer stable position. Stable cardiomegaly . No pulmonary
venous congestion. Small bilateral pleural effusions.

## 2017-05-02 IMAGING — DX DG CHEST 1V PORT
1 series · 1 of 1 positions shown · non-contrast
Comparison: November 08, 2016 chest radiograph and chest CT November 06, 2016

CLINICAL DATA: Shortness of breath and chest pain

EXAM:
PORTABLE CHEST 1 VIEW

[chest ap]
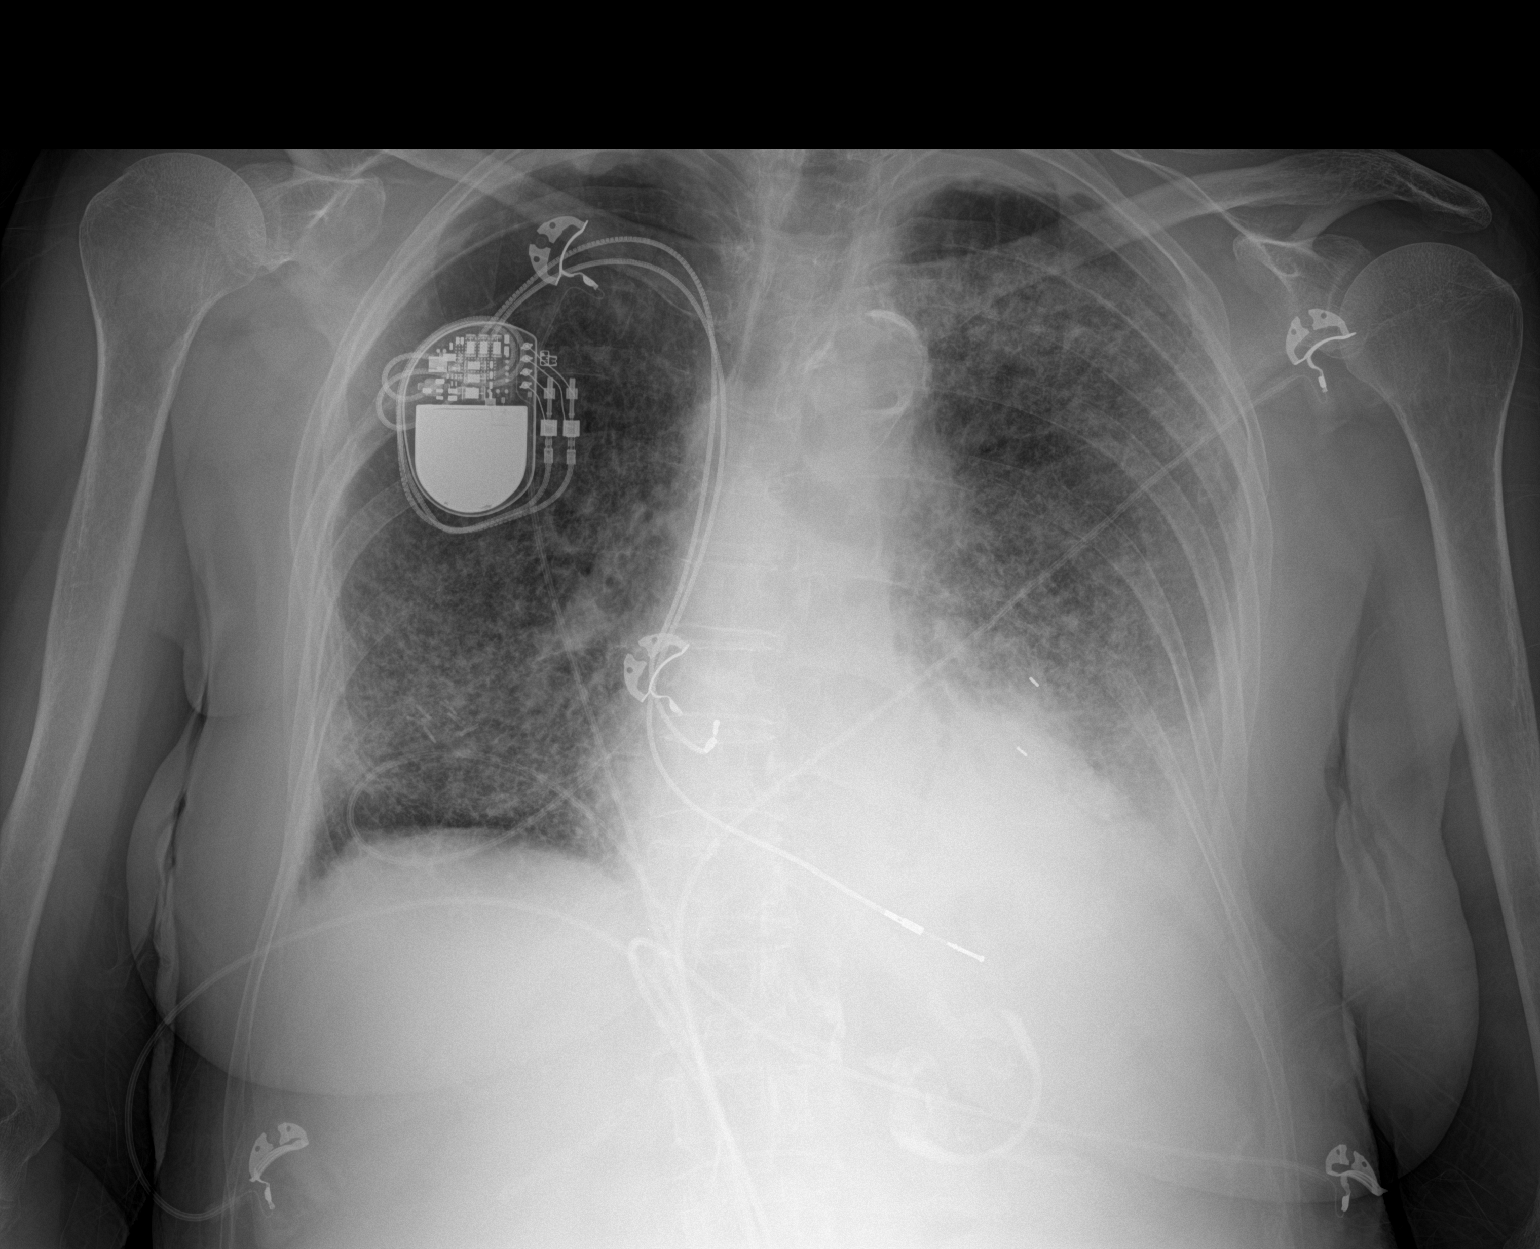

[1 of 1 positions shown; findings below may reference images not displayed]

FINDINGS: There is persistent consolidation in the left lower lobe with left
pleural effusion. There is underlying diffuse fibrotic type change.
There may well be superimposed interstitial edema in this area as
well. Heart is upper normal in size with pulmonary vascularity
within normal limits. Pacemaker leads are attached to the right
atrium and right ventricle. There is atherosclerotic calcification
in the aorta. There are surgical clips over the right breast.
IMPRESSION: Persistent airspace consolidation left base with left pleural
effusion. Suspect pneumonia in this area; underlying mass in this
area cannot be excluded.

Widespread pulmonary fibrosis remains. There may be a degree of
superimposed interstitial edema. The overall appearance is stable
compared to recent prior studies.

Cardiac silhouette is unchanged.  There is aortic atherosclerosis.
